# Patient Record
Sex: Female | Born: 1995 | Race: Black or African American | Hispanic: No | Marital: Single | Smoking: Current some day smoker
Health system: Southern US, Community
[De-identification: ages and names within clinical notes are randomized; demographics above are authoritative.]

## PROBLEM LIST (undated history)

## (undated) ENCOUNTER — Emergency Department (HOSPITAL_COMMUNITY): Payer: Medicaid Other | Source: Home / Self Care

## (undated) ENCOUNTER — Inpatient Hospital Stay (HOSPITAL_COMMUNITY): Payer: Self-pay

## (undated) DIAGNOSIS — O139 Gestational [pregnancy-induced] hypertension without significant proteinuria, unspecified trimester: Secondary | ICD-10-CM

## (undated) DIAGNOSIS — J45909 Unspecified asthma, uncomplicated: Secondary | ICD-10-CM

## (undated) HISTORY — PX: NO PAST SURGERIES: SHX2092

---
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
## 2017-01-23 ENCOUNTER — Other Ambulatory Visit (HOSPITAL_COMMUNITY)
Admission: RE | Admit: 2017-01-23 | Discharge: 2017-01-23 | Disposition: A | Discharge status: Home / Self Care | Payer: Medicaid Other | Source: Ambulatory Visit | Attending: Obstetrics | Admitting: Obstetrics

## 2017-01-23 ENCOUNTER — Encounter: Payer: Self-pay | Admitting: Obstetrics

## 2017-01-23 ENCOUNTER — Ambulatory Visit (INDEPENDENT_AMBULATORY_CARE_PROVIDER_SITE_OTHER): Payer: Medicaid Other | Admitting: Obstetrics

## 2017-01-23 VITALS — BP 128/89 | HR 77 | Wt 229.6 lb

## 2017-01-23 DIAGNOSIS — Z113 Encounter for screening for infections with a predominantly sexual mode of transmission: Secondary | ICD-10-CM

## 2017-01-23 DIAGNOSIS — Z0000 Encounter for general adult medical examination without abnormal findings: Secondary | ICD-10-CM

## 2017-01-23 DIAGNOSIS — Z3046 Encounter for surveillance of implantable subdermal contraceptive: Secondary | ICD-10-CM

## 2017-01-23 DIAGNOSIS — Z01419 Encounter for gynecological examination (general) (routine) without abnormal findings: Secondary | ICD-10-CM

## 2017-01-23 MED ORDER — PROVIDA OB 20-20-1.25 MG PO CAPS: 1.0000 | ORAL_CAPSULE | Freq: Every day | ORAL | 11 refills | Status: DC

## 2017-01-23 NOTE — Addendum Note (Signed)
Addended by: Coral CeoHARPER, CHARLES A on: 01/23/2017 10:58 AM   Modules accepted: Orders

## 2017-01-23 NOTE — Progress Notes (Signed)
Subjective:        Emma Jacobs is a 21 y.o. female here for a routine exam.  Current complaints: None.    Personal health questionnaire:  Is patient Emma Jacobs, have a family history of breast and/or ovarian cancer: no Is there a family history of uterine cancer diagnosed at age < 7550, gastrointestinal cancer, urinary tract cancer, family member who is a Personnel officerLynch syndrome-associated carrier: no Is the patient overweight and hypertensive, family history of diabetes, personal history of gestational diabetes, preeclampsia or PCOS: no Is patient over 555, have PCOS,  family history of premature CHD under age 19, diabetes, smoke, have hypertension or peripheral artery disease:  no At any time, has a partner hit, kicked or otherwise hurt or frightened you?: no Over the past 2 weeks, have you felt down, depressed or hopeless?: no Over the past 2 weeks, have you felt little interest or pleasure in doing things?:no   Gynecologic History Patient's last menstrual period was 01/22/2017. Contraception: Nexplanon Last Pap: n/a. Results were: n/a Last mammogram: n/a. Results were: n/a  Obstetric History OB History  Gravida Para Term Preterm AB Living  1 1 1     1   SAB TAB Ectopic Multiple Live Births          1    # Outcome Date GA Lbr Len/2nd Weight Sex Delivery Anes PTL Lv  1 Term 01/25/14 3625w2d 15:18 / 10:13 8 lb 2.2 oz (3.69 kg) M CS-LTranv EPI  LIV      Past Medical History:  Diagnosis Date  . Asthma    currently uses inhaler  . Gestational hypertension     Past Surgical History:  Procedure Laterality Date  . CESAREAN SECTION N/A 01/25/2014   Procedure: CESAREAN SECTION;  Surgeon: Antionette CharLisa Jackson-Moore, MD;  Location: [facility];  Service: Obstetrics;  Laterality: N/A;  . NO PAST SURGERIES      No current outpatient prescriptions on file. No Known Allergies  Social History  Substance Use Topics  . Smoking status: Never Smoker  . Smokeless tobacco: Never Used  . Alcohol use  No    Family History  Problem Relation Age of Onset  . Hypertension Maternal Grandmother   . Diabetes Maternal Grandmother       Review of Systems  Constitutional: negative for fatigue and weight loss Respiratory: negative for cough and wheezing Cardiovascular: negative for chest pain, fatigue and palpitations Gastrointestinal: negative for abdominal pain and change in bowel habits Musculoskeletal:negative for myalgias Neurological: negative for gait problems and tremors Behavioral/Psych: negative for abusive relationship, depression Endocrine: negative for temperature intolerance    Genitourinary:negative for abnormal menstrual periods, genital lesions, hot flashes, sexual problems and vaginal discharge Integument/breast: negative for breast lump, breast tenderness, nipple discharge and skin lesion(s)    Objective:       BP 128/89   Pulse 77   Wt 229 lb 9.6 oz (104.1 kg)   LMP 01/22/2017 Comment: irregular cycles with nexplanon  BMI 38.21 kg/m  General:   alert  Skin:   no rash or abnormalities  Lungs:   clear to auscultation bilaterally  Heart:   regular rate and rhythm, S1, S2 normal, no murmur, click, rub or gallop  Breasts:   normal without suspicious masses, skin or nipple changes or axillary nodes  Abdomen:  normal findings: no organomegaly, soft, non-tender and no hernia  Pelvis:  External genitalia: normal general appearance Urinary system: urethral meatus normal and bladder without fullness, nontender Vaginal: normal without tenderness, induration  or masses Cervix: normal appearance Adnexa: normal bimanual exam Uterus: anteverted and non-tender, normal size   Lab Review Urine pregnancy test Labs reviewed yes Radiologic studies reviewed no  50% of 20 min visit spent on counseling and coordination of care.    Assessment:    Healthy female exam.    Contraceptive Management   Plan:    Education reviewed: calcium supplements, low fat, low cholesterol  diet, safe sex/STD prevention and weight bearing exercise. Contraception: Nexplanon. Follow up in: 3 weeks.  Nexplanon removal and insertion of new Nexplanon  No orders of the defined types were placed in this encounter.  No orders of the defined types were placed in this encounter.    Patient ID: Emma Jacobs, female   DOB: 20-Dec-1995, 21 y.o.   MRN: [id number]

## 2017-01-23 NOTE — Progress Notes (Signed)
Patient is in the office today for her nexplanon removal- she would like a replacement. She elects to do a removal/replacement at one time- so we are going to order her device today and she will come back for that .  She is not sexually active now- but she has been in the past. She request STD testing today with her gyn exam.

## 2017-01-24 LAB — CERVICOVAGINAL ANCILLARY ONLY
BACTERIAL VAGINITIS: POSITIVE — AB
CHLAMYDIA, DNA PROBE: POSITIVE — AB
Candida vaginitis: NEGATIVE
NEISSERIA GONORRHEA: NEGATIVE
TRICH (WINDOWPATH): NEGATIVE

## 2017-01-25 ENCOUNTER — Other Ambulatory Visit: Payer: Self-pay | Admitting: Obstetrics

## 2017-01-25 DIAGNOSIS — N760 Acute vaginitis: Secondary | ICD-10-CM

## 2017-01-25 DIAGNOSIS — A5602 Chlamydial vulvovaginitis: Secondary | ICD-10-CM

## 2017-01-25 DIAGNOSIS — B9689 Other specified bacterial agents as the cause of diseases classified elsewhere: Secondary | ICD-10-CM

## 2017-01-25 MED ORDER — AZITHROMYCIN 250 MG PO TABS: 1000.0000 mg | ORAL_TABLET | Freq: Once | ORAL | 0 refills | Status: AC

## 2017-01-25 MED ORDER — CEFIXIME 400 MG PO TABS: 400.0000 mg | ORAL_TABLET | Freq: Once | ORAL | 0 refills | Status: AC

## 2017-01-25 MED ORDER — TINIDAZOLE 500 MG PO TABS: 1000.0000 mg | ORAL_TABLET | Freq: Every day | ORAL | 2 refills | Status: DC

## 2017-01-28 ENCOUNTER — Telehealth: Payer: Self-pay

## 2017-01-28 NOTE — Telephone Encounter (Signed)
Spoke with patient and advised of results, rx and follow up testing. Patient stated that she has an appt on the 15th and would schedule follow up appt then.

## 2017-02-14 ENCOUNTER — Ambulatory Visit: Payer: Medicaid Other | Admitting: Obstetrics

## 2017-04-16 ENCOUNTER — Encounter: Payer: Self-pay | Admitting: Obstetrics

## 2017-04-16 ENCOUNTER — Other Ambulatory Visit (HOSPITAL_COMMUNITY)
Admission: RE | Admit: 2017-04-16 | Discharge: 2017-04-16 | Disposition: A | Discharge status: Home / Self Care | Payer: Medicaid Other | Source: Ambulatory Visit | Attending: Obstetrics | Admitting: Obstetrics

## 2017-04-16 ENCOUNTER — Ambulatory Visit (INDEPENDENT_AMBULATORY_CARE_PROVIDER_SITE_OTHER): Payer: Medicaid Other | Admitting: Obstetrics

## 2017-04-16 ENCOUNTER — Encounter: Payer: Self-pay | Admitting: *Deleted

## 2017-04-16 VITALS — BP 114/76 | Wt 234.6 lb

## 2017-04-16 DIAGNOSIS — Z3049 Encounter for surveillance of other contraceptives: Secondary | ICD-10-CM | POA: Diagnosis not present

## 2017-04-16 DIAGNOSIS — Z3046 Encounter for surveillance of implantable subdermal contraceptive: Secondary | ICD-10-CM

## 2017-04-16 DIAGNOSIS — Z3202 Encounter for pregnancy test, result negative: Secondary | ICD-10-CM

## 2017-04-16 DIAGNOSIS — Z8619 Personal history of other infectious and parasitic diseases: Secondary | ICD-10-CM | POA: Diagnosis not present

## 2017-04-16 LAB — POCT URINE PREGNANCY: PREG TEST UR: NEGATIVE

## 2017-04-16 NOTE — Progress Notes (Signed)
Nexplanon removal expired 3/15. Nexplanon reinsertion today. Last Annual 01/23/17. +CT 01/23/17. Txt completed per pt, TOC due.

## 2017-04-16 NOTE — Progress Notes (Signed)
NEXPLANON REMOVAL NOTE  Date of LMP:   unknown  Contraception used: *Nexplanon   Indications:  The patient desires contraception.  She understands risks, benefits, and alternatives to Implanon and would like to proceed.  Anesthesia:   Lidocaine 1% plain.  Procedure:  A time-out was performed confirming the procedure and the patient's allergy status.  Complications: None                      The rod was palpated and the area was sterilely prepped.  The area beneath the distal tip was anesthetized with 1% xylocaine and the skin incised                       Over the tip and the tip was exposed, grasped with forcep and removed intact.  A single suture of 4-0 Vicryl was used to close incision.  Steri strip applied.                          Nexplanon Procedure Note   PRE-OP DIAGNOSIS: desired long-term, reversible contraception ( LARC ) POST-OP DIAGNOSIS: Same  PROCEDURE: Nexplanon  placement Performing Provider: Bing Neighborsharles A. Clearance CootsHarper MD  Patient education prior to procedure, explained risk, benefits of Nexplanon, reviewed alternative options. Patient reported understanding. Gave consent to continue with procedure.   PROCEDURE:  Pregnancy Text :  Negative Site (check):      left arm         Sterile Preparation:   Betadinex3 Lot # [id number] Expiration Date 05 / 2020  Insertion site was selected 8 - 10 cm from medial epicondyle and marked along with guiding site using sterile marker. Procedure area was prepped and draped in a sterile fashion. 1% Lidocaine 1.5 ml given prior to procedure. Nexplanon  was inserted subcutaneously.Needle was removed from the insertion site. Nexplanon capsule was palpated by provider and patient to assure satisfactory placement. Dressing applied.  Followup: The patient tolerated the procedure well without complications.  Standard post-procedure care is explained and return precautions are given.  Clyde Upshaw A. Clearance CootsHarper MD

## 2017-04-17 LAB — URINE CYTOLOGY ANCILLARY ONLY
Chlamydia: NEGATIVE
Neisseria Gonorrhea: NEGATIVE
Trichomonas: NEGATIVE

## 2017-04-18 ENCOUNTER — Encounter: Payer: Self-pay | Admitting: Obstetrics

## 2017-05-01 ENCOUNTER — Ambulatory Visit: Payer: Medicaid Other | Admitting: Obstetrics

## 2019-04-14 ENCOUNTER — Other Ambulatory Visit: Payer: Self-pay

## 2019-04-14 ENCOUNTER — Ambulatory Visit (HOSPITAL_COMMUNITY)
Admission: EM | Admit: 2019-04-14 | Discharge: 2019-04-14 | Disposition: A | Discharge status: Home / Self Care | Payer: Medicaid Other | Attending: Family Medicine | Admitting: Family Medicine

## 2019-04-14 ENCOUNTER — Encounter (HOSPITAL_COMMUNITY): Payer: Self-pay | Admitting: Family Medicine

## 2019-04-14 DIAGNOSIS — T7840XA Allergy, unspecified, initial encounter: Secondary | ICD-10-CM | POA: Diagnosis not present

## 2019-04-14 MED ORDER — PREDNISONE 20 MG PO TABS: ORAL_TABLET | ORAL | 0 refills | Status: DC

## 2019-04-14 NOTE — ED Triage Notes (Signed)
Pt states on Sunday she broke out in hives all over her body, states the hives went away but now she has itchiness all over her body.

## 2019-04-14 NOTE — ED Provider Notes (Addendum)
[facility]    CSN: [id number] Arrival date & time: 04/14/19  1539     History   Chief Complaint Chief Complaint  Patient presents with  . Allergic Reaction    HPI Emma Jacobs is a 23 y.o. female.   Initial [facility] visit for this 23 yo woman complaining of hand and foot discomfort on palms and soles for 3 days with no new medication or exposure known.  No fever, shortness of breath, h/o hives, swelling in mouth.  Unemployed.  Not in school.     Past Medical History:  Diagnosis Date  . Asthma    currently uses inhaler  . Gestational hypertension     Patient Active Problem List   Diagnosis Date Noted  . Cesarean delivery delivered 01/25/2014  . Labor and delivery indication for care or intervention 01/24/2014  . Pregnancy 12/25/2013  . Gestational hypertension 12/15/2013  . Pyelectasis of fetus on prenatal ultrasound 09/18/2013  . Teen pregnancy 08/11/2013  . Family history of diabetes mellitus (DM) 08/11/2013  . Obesity 08/11/2013    Past Surgical History:  Procedure Laterality Date  . CESAREAN SECTION N/A 01/25/2014   Procedure: CESAREAN SECTION;  Surgeon: Antionette CharLisa Jackson-Moore, MD;  Location: [facility];  Service: Obstetrics;  Laterality: N/A;  . NO PAST SURGERIES      OB History    Gravida  1   Para  1   Term  1   Preterm      AB      Living  1     SAB      TAB      Ectopic      Multiple      Live Births  1            Home Medications    Prior to Admission medications   Medication Sig Start Date End Date Taking? Authorizing Provider  predniSONE (DELTASONE) 20 MG tablet Two daily with food 04/14/19   Elvina SidleLauenstein, Brando Taves, MD    Family History Family History  Problem Relation Age of Onset  . Hypertension Maternal Grandmother   . Diabetes Maternal Grandmother     Social History Social History   Tobacco Use  . Smoking status: Never Smoker  . Smokeless tobacco: Never Used  Substance Use Topics  . Alcohol use: No  .  Drug use: No     Allergies   Patient has no known allergies.   Review of Systems Review of Systems   Physical Exam Triage Vital Signs ED Triage Vitals  Enc Vitals Group     BP      Pulse      Resp      Temp      Temp src      SpO2      Weight      Height      Head Circumference      Peak Flow      Pain Score      Pain Loc      Pain Edu?      Excl. in GC?    No data found.  Updated Vital Signs BP (!) 170/110   Pulse (!) 118   Temp 98.4 F (36.9 C)   Resp 18   LMP 03/27/2019   SpO2 100%    Physical Exam Vitals signs and nursing note reviewed.  Constitutional:      Appearance: Normal appearance.  HENT:     Nose: Nose normal.  Mouth/Throat:     Mouth: Mucous membranes are moist.  Eyes:     Extraocular Movements: Extraocular movements intact.     Pupils: Pupils are equal, round, and reactive to light.  Cardiovascular:     Rate and Rhythm: Normal rate and regular rhythm.  Pulmonary:     Effort: Pulmonary effort is normal.     Breath sounds: Normal breath sounds.  Musculoskeletal: Normal range of motion.  Skin:    General: Skin is warm and dry.     Comments: Few wheals on volar forearms  Neurological:     General: No focal deficit present.     Mental Status: She is alert.      UC Treatments / Results  Labs (all labs ordered are listed, but only abnormal results are displayed) Labs Reviewed - No data to display  EKG None  Radiology No results found.  Procedures Procedures (including critical care time)  Medications Ordered in UC Medications - No data to display  Initial Impression / Assessment and Plan / UC Course  I have reviewed the triage vital signs and the nursing notes.  Pertinent labs & imaging results that were available during my care of the patient were reviewed by me and considered in my medical decision making (see chart for details).    Final Clinical Impressions(s) / UC Diagnoses   Final diagnoses:  Allergic  reaction, initial encounter   Discharge Instructions   None    ED Prescriptions    Medication Sig Dispense Auth. Provider   predniSONE (DELTASONE) 20 MG tablet Two daily with food 10 tablet Elvina Sidle, MD     Controlled Substance Prescriptions [place] Controlled Substance Registry consulted? Not Applicable   Elvina Sidle, MD 04/14/19 1607    Elvina Sidle, MD 04/14/19 [phone]

## 2019-07-06 ENCOUNTER — Other Ambulatory Visit: Payer: Self-pay

## 2019-07-06 DIAGNOSIS — Z20822 Contact with and (suspected) exposure to covid-19: Secondary | ICD-10-CM

## 2019-07-07 LAB — NOVEL CORONAVIRUS, NAA: SARS-CoV-2, NAA: NOT DETECTED

## 2019-07-09 ENCOUNTER — Telehealth: Payer: Self-pay | Admitting: General Practice

## 2019-07-09 NOTE — Telephone Encounter (Signed)
Patient called in and received her covid test results °

## 2019-07-22 ENCOUNTER — Encounter (HOSPITAL_COMMUNITY): Payer: Self-pay | Admitting: Emergency Medicine

## 2019-07-22 ENCOUNTER — Ambulatory Visit (HOSPITAL_COMMUNITY)
Admission: EM | Admit: 2019-07-22 | Discharge: 2019-07-22 | Disposition: A | Discharge status: Home / Self Care | Payer: Medicaid Other | Attending: Family Medicine | Admitting: Family Medicine

## 2019-07-22 ENCOUNTER — Other Ambulatory Visit: Payer: Self-pay

## 2019-07-22 DIAGNOSIS — H9202 Otalgia, left ear: Secondary | ICD-10-CM | POA: Diagnosis not present

## 2019-07-22 DIAGNOSIS — T169XXA Foreign body in ear, unspecified ear, initial encounter: Secondary | ICD-10-CM

## 2019-07-22 MED ORDER — IBUPROFEN 800 MG PO TABS
ORAL_TABLET | ORAL | Status: AC
  Filled 2019-07-22: qty 1

## 2019-07-22 MED ORDER — IBUPROFEN 800 MG PO TABS: 800.0000 mg | ORAL_TABLET | Freq: Three times a day (TID) | ORAL | 0 refills | Status: DC

## 2019-07-22 MED ORDER — AMOXICILLIN 875 MG PO TABS: 875.0000 mg | ORAL_TABLET | Freq: Two times a day (BID) | ORAL | 0 refills | Status: AC

## 2019-07-22 MED ORDER — IBUPROFEN 800 MG PO TABS
800.0000 mg | ORAL_TABLET | Freq: Once | ORAL | Status: AC
  Administered 2019-07-22: 800 mg via ORAL

## 2019-07-22 NOTE — ED Triage Notes (Signed)
Pt here for left ear pain 

## 2019-07-22 NOTE — ED Provider Notes (Signed)
[place]   [id number] 07/22/19 Arrival Time: 9767  ASSESSMENT & PLAN:  1. Otalgia of left ear   2. Acute foreign body of ear canal, initial encounter     Feeling better after flushing ear. Unable to remove with curette. Pieces of insect removed. Ear canal remains very swollen and erythematous. Questions some erythema of TM; difficult to fully visualize. Will cover for infection. Discussed.  Meds ordered this encounter  Medications  . ibuprofen (ADVIL) tablet 800 mg  . amoxicillin (AMOXIL) 875 MG tablet    Sig: Take 1 tablet (875 mg total) by mouth 2 (two) times daily for 7 days.    Dispense:  14 tablet    Refill:  0  . ibuprofen (ADVIL) 800 MG tablet    Sig: Take 1 tablet (800 mg total) by mouth 3 (three) times daily with meals.    Dispense:  21 tablet    Refill:  0   If not improving within the next 24-48 hours, recommend: Follow-up Information    Schedule an appointment as soon as possible for a visit  with [facility].   Contact information: [address] [place] [address] [phone]          Reviewed expectations re: course of current medical issues. Questions answered. Outlined signs and symptoms indicating need for more acute intervention. Patient verbalized understanding. After Visit Summary given.   SUBJECTIVE: History from: patient.  Emma Jacobs is a 23 y.o. female who presents with complaint of left otalgia; without drainage; without bleeding. Onset gradual, over the past week she thinks. "Thought a bug may have flew into my ear". Used tissue and a Q-tip to "clean" her ear. No acute pain. Ear discomfort slowly increasing. Slightly muffled hearing. Recent cold symptoms: none. Fever: no. Overall normal PO intake without n/v. Sick contacts: no. OTC medications: none reproted.  Social History   Tobacco Use  Smoking Status Never Smoker  Smokeless Tobacco Never Used    ROS: As per HPI. All  other systems negative.    OBJECTIVE:  Vitals:   07/22/19 1116  BP: (!) 154/89  Pulse: 78  Resp: 18  Temp: 98.6 F (37 C)  TempSrc: Oral  SpO2: 98%    General appearance: alert; appears fatigued Ear Canal: edema and inflammation on the left TM: difficult to visualized left TM Oropharynx: moist Neck: supple without LAD Lungs: unlabored respirations, symmetrical air entry; cough: absent; no respiratory distress Skin: warm and dry Ext: no edema Psychological: alert and cooperative; normal mood and affect  No Known Allergies  Past Medical History:  Diagnosis Date  . Asthma    currently uses inhaler  . Gestational hypertension    Family History  Problem Relation Age of Onset  . Hypertension Maternal Grandmother   . Diabetes Maternal Grandmother    Social History   Socioeconomic History  . Marital status: Single    Spouse name: Not on file  . Number of children: Not on file  . Years of education: Not on file  . Highest education level: Not on file  Occupational History  . Not on file  Social Needs  . Financial resource strain: Not on file  . Food insecurity    Worry: Not on file    Inability: Not on file  . Transportation needs    Medical: Not on file    Non-medical: Not on file  Tobacco Use  . Smoking status: Never Smoker  . Smokeless tobacco:  Never Used  Substance and Sexual Activity  . Alcohol use: No  . Drug use: No  . Sexual activity: Not Currently    Partners: Male    Birth control/protection: Implant  Lifestyle  . Physical activity    Days per week: Not on file    Minutes per session: Not on file  . Stress: Not on file  Relationships  . Social Musicianconnections    Talks on phone: Not on file    Gets together: Not on file    Attends religious service: Not on file    Active member of club or organization: Not on file    Attends meetings of clubs or organizations: Not on file    Relationship status: Not on file  . Intimate partner violence     Fear of current or ex partner: Not on file    Emotionally abused: Not on file    Physically abused: Not on file    Forced sexual activity: Not on file  Other Topics Concern  . Not on file  Social History Narrative  . Not on file            Mardella LaymanHagler, Aki Abalos, MD 07/22/19 1200

## 2020-05-10 ENCOUNTER — Ambulatory Visit: Payer: Medicaid Other | Admitting: Obstetrics

## 2020-05-16 ENCOUNTER — Ambulatory Visit: Payer: Medicaid Other | Admitting: Obstetrics

## 2020-08-29 ENCOUNTER — Encounter: Payer: Self-pay | Admitting: Obstetrics

## 2020-08-29 ENCOUNTER — Other Ambulatory Visit: Payer: Self-pay

## 2020-08-29 ENCOUNTER — Ambulatory Visit (INDEPENDENT_AMBULATORY_CARE_PROVIDER_SITE_OTHER): Payer: Medicaid Other | Admitting: Obstetrics

## 2020-08-29 VITALS — BP 159/99 | HR 78 | Wt 259.0 lb

## 2020-08-29 DIAGNOSIS — Z3046 Encounter for surveillance of implantable subdermal contraceptive: Secondary | ICD-10-CM | POA: Diagnosis not present

## 2020-08-29 NOTE — Progress Notes (Signed)
NEXPLANON REMOVAL NOTE  Date of LMP:   unknown  Contraception used: *Nexplanon   Indications:  The patient desires removal of Nexplanon.  She understands risks, benefits, and alternatives to Implanon and would like to proceed.  Anesthesia:   Lidocaine 1% plain.  Procedure:  A time-out was performed confirming the procedure and the patient's allergy status.  Complications: None                      The rod was palpated and the area was sterilely prepped.  The area beneath the distal tip was anesthetized with 1% xylocaine and the skin incised                       Over the tip and the tip was exposed, grasped with forcep and removed intact.  A single suture of 4-0 Vicryl was used to close incision.  Steri strip                       And a bandage applied and the arm was wrapped with gauze bandage.  The patient tolerated well.  Instructions:  The patient was instructed to remove the dressing in 24 hours and that some bruising is to be expected.  She was advised to use over the counter analgesics as needed for any pain at the site.  She is to keep the area dry for 24 hours and to call if her hand or arm becomes cold, numb, or blue.  Return visit:  Return in 2 weeks   Brock Bad, MD 08/29/2020 3:34 PM

## 2020-09-12 ENCOUNTER — Encounter: Payer: Self-pay | Admitting: Obstetrics

## 2020-09-12 ENCOUNTER — Ambulatory Visit (INDEPENDENT_AMBULATORY_CARE_PROVIDER_SITE_OTHER): Payer: Medicaid Other | Admitting: Obstetrics

## 2020-09-12 ENCOUNTER — Other Ambulatory Visit: Payer: Self-pay

## 2020-09-12 VITALS — BP 151/95 | HR 89 | Ht 65.0 in | Wt 256.1 lb

## 2020-09-12 DIAGNOSIS — Z09 Encounter for follow-up examination after completed treatment for conditions other than malignant neoplasm: Secondary | ICD-10-CM | POA: Diagnosis not present

## 2020-09-12 NOTE — Progress Notes (Signed)
Subjective:    Emma Jacobs is a 24 y.o. female who presents for follow up after Nexplanon removal. The patient has no complaints today. The patient is sexually active. Pertinent past medical history: hypertension.  The information documented in the HPI was reviewed and verified.  Menstrual History: OB History    Gravida  1   Para  1   Term  1   Preterm      AB      Living  1     SAB      TAB      Ectopic      Multiple      Live Births  1            Patient's last menstrual period was 09/12/2020.   Patient Active Problem List   Diagnosis Date Noted  . Cesarean delivery delivered 01/25/2014  . Labor and delivery indication for care or intervention 01/24/2014  . Pregnancy 12/25/2013  . Gestational hypertension 12/15/2013  . Pyelectasis of fetus on prenatal ultrasound 09/18/2013  . Teen pregnancy 08/11/2013  . Family history of diabetes mellitus (DM) 08/11/2013  . Obesity 08/11/2013   Past Medical History:  Diagnosis Date  . Asthma    currently uses inhaler  . Gestational hypertension     Past Surgical History:  Procedure Laterality Date  . CESAREAN SECTION N/A 01/25/2014   Procedure: CESAREAN SECTION;  Surgeon: Antionette Char, MD;  Location: [facility];  Service: Obstetrics;  Laterality: N/A;  . NO PAST SURGERIES       Current Outpatient Medications:  .  ibuprofen (ADVIL) 800 MG tablet, Take 1 tablet (800 mg total) by mouth 3 (three) times daily with meals. (Patient not taking: Reported on 08/29/2020), Disp: 21 tablet, Rfl: 0 No Known Allergies  Social History   Tobacco Use  . Smoking status: Never Smoker  . Smokeless tobacco: Never Used  Substance Use Topics  . Alcohol use: No    Family History  Problem Relation Age of Onset  . Hypertension Maternal Grandmother   . Diabetes Maternal Grandmother        Review of Systems Constitutional: negative for weight loss Genitourinary:negative for abnormal menstrual periods and vaginal  discharge   Objective:   BP (!) 151/95   Pulse 89   Ht 5\' 5"  (1.651 m)   Wt 256 lb 1.6 oz (116.2 kg)   LMP 09/12/2020   BMI 42.62 kg/m    General:   alert and no distress  Skin:   no rash or abnormalities  Lungs:   clear to auscultation bilaterally  Heart:   regular rate and rhythm, S1, S2 normal, no murmur, click, rub or gallop  Breasts:   normal without suspicious masses, skin or nipple changes or axillary nodes  Abdomen:  normal findings: no organomegaly, soft, non-tender and no hernia  Pelvis:  External genitalia: normal general appearance Urinary system: urethral meatus normal and bladder without fullness, nontender Vaginal: normal without tenderness, induration or masses Cervix: normal appearance Adnexa: normal bimanual exam Uterus: anteverted and non-tender, normal size   Lab Review Urine pregnancy test Labs reviewed yes Radiologic studies reviewed no  50% of 15 min visit spent on counseling and coordination of care.    Assessment:    24 y.o., discontinuing Nexplanon, no contraindications.   Declines further contraception at this time.  Condoms encouraged to prevent STD's.  Plan:    All questions answered. Follow up in 3 months.  Annual / Pap  23, MD  09/12/2020 3:04 PM

## 2020-09-12 NOTE — Progress Notes (Signed)
Pt is in the office for nexplanon removal follow up from 08-29-20. Pt denies any complications.

## 2021-02-20 ENCOUNTER — Emergency Department (HOSPITAL_COMMUNITY)
Admission: EM | Admit: 2021-02-20 | Discharge: 2021-02-20 | Disposition: A | Discharge status: Home / Self Care | Payer: Medicaid Other | Attending: Emergency Medicine | Admitting: Emergency Medicine

## 2021-02-20 DIAGNOSIS — J45909 Unspecified asthma, uncomplicated: Secondary | ICD-10-CM | POA: Insufficient documentation

## 2021-02-20 DIAGNOSIS — K5900 Constipation, unspecified: Secondary | ICD-10-CM | POA: Insufficient documentation

## 2021-02-20 DIAGNOSIS — R1013 Epigastric pain: Secondary | ICD-10-CM | POA: Diagnosis present

## 2021-02-20 DIAGNOSIS — M545 Low back pain, unspecified: Secondary | ICD-10-CM | POA: Insufficient documentation

## 2021-02-20 DIAGNOSIS — K2900 Acute gastritis without bleeding: Secondary | ICD-10-CM | POA: Diagnosis not present

## 2021-02-20 LAB — URINALYSIS, ROUTINE W REFLEX MICROSCOPIC
Bilirubin Urine: NEGATIVE
Glucose, UA: NEGATIVE mg/dL
Hgb urine dipstick: NEGATIVE
Ketones, ur: NEGATIVE mg/dL
Leukocytes,Ua: NEGATIVE
Nitrite: NEGATIVE
Protein, ur: NEGATIVE mg/dL
Specific Gravity, Urine: 1.003 — ABNORMAL LOW (ref 1.005–1.030)
pH: 6 (ref 5.0–8.0)

## 2021-02-20 LAB — COMPREHENSIVE METABOLIC PANEL
ALT: 10 U/L (ref 0–44)
AST: 16 U/L (ref 15–41)
Albumin: 3.6 g/dL (ref 3.5–5.0)
Alkaline Phosphatase: 85 U/L (ref 38–126)
Anion gap: 7 (ref 5–15)
BUN: <5 mg/dL — ABNORMAL LOW (ref 6–20)
CO2: 25 mmol/L (ref 22–32)
Calcium: 9.4 mg/dL (ref 8.9–10.3)
Chloride: 103 mmol/L (ref 98–111)
Creatinine, Ser: 0.57 mg/dL (ref 0.44–1.00)
GFR, Estimated: >60 mL/min (ref >60)
Glucose, Bld: 162 mg/dL — ABNORMAL HIGH (ref 70–99)
Potassium: 3.9 mmol/L (ref 3.5–5.1)
Sodium: 135 mmol/L (ref 135–145)
Total Bilirubin: 0.4 mg/dL (ref 0.3–1.2)
Total Protein: 7 g/dL (ref 6.5–8.1)

## 2021-02-20 LAB — CBC
HCT: 35.7 % — ABNORMAL LOW (ref 36.0–46.0)
Hemoglobin: 10.9 g/dL — ABNORMAL LOW (ref 12.0–15.0)
MCH: 22.9 pg — ABNORMAL LOW (ref 26.0–34.0)
MCHC: 30.5 g/dL (ref 30.0–36.0)
MCV: 75 fL — ABNORMAL LOW (ref 80.0–100.0)
Platelets: 324 10*3/uL (ref 150–400)
RBC: 4.76 MIL/uL (ref 3.87–5.11)
RDW: 15.9 % — ABNORMAL HIGH (ref 11.5–15.5)
WBC: 9.2 10*3/uL (ref 4.0–10.5)
nRBC: 0 % (ref 0.0–0.2)

## 2021-02-20 LAB — LIPASE, BLOOD: Lipase: 25 U/L (ref 11–51)

## 2021-02-20 LAB — I-STAT BETA HCG BLOOD, ED (MC, WL, AP ONLY): I-stat hCG, quantitative: <5 m[IU]/mL (ref <5)

## 2021-02-20 MED ORDER — ALUM & MAG HYDROXIDE-SIMETH 200-200-20 MG/5ML PO SUSP
30.0000 mL | Freq: Once | ORAL | Status: AC
Start: 2021-02-20 — End: 2021-02-20
  Administered 2021-02-20: 30 mL via ORAL
  Filled 2021-02-20: qty 30

## 2021-02-20 MED ORDER — KETOROLAC TROMETHAMINE 30 MG/ML IJ SOLN
30.0000 mg | Freq: Once | INTRAMUSCULAR | Status: AC
  Administered 2021-02-20: 30 mg via INTRAVENOUS
  Filled 2021-02-20: qty 1

## 2021-02-20 MED ORDER — FAMOTIDINE IN NACL 20-0.9 MG/50ML-% IV SOLN
20.0000 mg | Freq: Once | INTRAVENOUS | Status: AC
  Administered 2021-02-20: 20 mg via INTRAVENOUS
  Filled 2021-02-20: qty 50

## 2021-02-20 MED ORDER — FAMOTIDINE 20 MG PO TABS: 20.0000 mg | ORAL_TABLET | Freq: Two times a day (BID) | ORAL | 0 refills | Status: DC

## 2021-02-20 MED ORDER — SODIUM CHLORIDE 0.9 % IV BOLUS
1000.0000 mL | Freq: Once | INTRAVENOUS | Status: AC
  Administered 2021-02-20: 1000 mL via INTRAVENOUS

## 2021-02-20 NOTE — ED Provider Notes (Signed)
[facility] EMERGENCY DEPARTMENT Provider Note   CSN: [id number] Arrival date & time: 02/20/21  1305     History Chief Complaint  Patient presents with  . Abdominal Pain    Emma Jacobs is a 25 y.o. female with a past medical history of asthma presenting to the ED with a chief complaint of epigastric pain and lower back pain.  Symptoms began about 3 days ago.  States that she is alternated between constipation and diarrhea since her pain began.  She initially thought it was due to her menstrual cycle ending but typically has pain lower in her abdomen and pelvis area and not epigastric.  She has tried Excedrin with only minimal improvement in her symptoms.  Pain is intermittent without specific aggravating alleviating factor.  Denies any nausea, vomiting, urinary symptoms, fever, chest pain or shortness of breath.  No history of similar symptoms in the past.  Prior abdominal surgeries include C-section.  No sick contacts with similar symptoms or suspicious food intake.  HPI     Past Medical History:  Diagnosis Date  . Asthma    currently uses inhaler  . Gestational hypertension     Patient Active Problem List   Diagnosis Date Noted  . Cesarean delivery delivered 01/25/2014  . Labor and delivery indication for care or intervention 01/24/2014  . Pregnancy 12/25/2013  . Gestational hypertension 12/15/2013  . Pyelectasis of fetus on prenatal ultrasound 09/18/2013  . Teen pregnancy 08/11/2013  . Family history of diabetes mellitus (DM) 08/11/2013  . Obesity 08/11/2013    Past Surgical History:  Procedure Laterality Date  . CESAREAN SECTION N/A 01/25/2014   Procedure: CESAREAN SECTION;  Surgeon: Antionette Char, MD;  Location: [facility];  Service: Obstetrics;  Laterality: N/A;  . NO PAST SURGERIES       OB History    Gravida  1   Para  1   Term  1   Preterm      AB      Living  1     SAB      IAB      Ectopic      Multiple      Live  Births  1           Family History  Problem Relation Age of Onset  . Hypertension Maternal Grandmother   . Diabetes Maternal Grandmother     Social History   Tobacco Use  . Smoking status: Never Smoker  . Smokeless tobacco: Never Used  Substance Use Topics  . Alcohol use: No  . Drug use: No    Home Medications Prior to Admission medications   Medication Sig Start Date End Date Taking? Authorizing Provider  famotidine (PEPCID) 20 MG tablet Take 1 tablet (20 mg total) by mouth 2 (two) times daily. 02/20/21  Yes Khatri, Hina, PA-C  ibuprofen (ADVIL) 800 MG tablet Take 1 tablet (800 mg total) by mouth 3 (three) times daily with meals. Patient not taking: Reported on 08/29/2020 07/22/19   Mardella Layman, MD    Allergies    Patient has no known allergies.  Review of Systems   Review of Systems  Constitutional: Negative for appetite change, chills and fever.  HENT: Negative for ear pain, rhinorrhea, sneezing and sore throat.   Eyes: Negative for photophobia and visual disturbance.  Respiratory: Negative for cough, chest tightness, shortness of breath and wheezing.   Cardiovascular: Negative for chest pain and palpitations.  Gastrointestinal: Positive for abdominal pain, constipation and  diarrhea. Negative for blood in stool, nausea and vomiting.  Genitourinary: Negative for dysuria, hematuria and urgency.  Musculoskeletal: Positive for back pain and myalgias.  Skin: Negative for rash.  Neurological: Negative for dizziness, weakness and light-headedness.    Physical Exam Updated Vital Signs BP 135/84 (BP Location: Right Arm)   Pulse 85   Temp 98.9 F (37.2 C)   Resp 17   SpO2 100%   Physical Exam Vitals and nursing note reviewed.  Constitutional:      General: She is not in acute distress.    Appearance: She is well-developed.  HENT:     Head: Normocephalic and atraumatic.     Nose: Nose normal.  Eyes:     General: No scleral icterus.       Left eye: No  discharge.     Conjunctiva/sclera: Conjunctivae normal.  Cardiovascular:     Rate and Rhythm: Normal rate and regular rhythm.     Heart sounds: Normal heart sounds. No murmur heard. No friction rub. No gallop.   Pulmonary:     Effort: Pulmonary effort is normal. No respiratory distress.     Breath sounds: Normal breath sounds.  Abdominal:     General: Bowel sounds are normal. There is no distension.     Palpations: Abdomen is soft.     Tenderness: There is abdominal tenderness in the epigastric area. There is no guarding.  Musculoskeletal:        General: Normal range of motion.     Cervical back: Normal range of motion and neck supple.  Skin:    General: Skin is warm and dry.     Findings: No rash.  Neurological:     Mental Status: She is alert.     Motor: No abnormal muscle tone.     Coordination: Coordination normal.     ED Results / Procedures / Treatments   Labs (all labs ordered are listed, but only abnormal results are displayed) Labs Reviewed  COMPREHENSIVE METABOLIC PANEL - Abnormal; Notable for the following components:      Result Value   Glucose, Bld 162 (*)    BUN <5 (*)    All other components within normal limits  CBC - Abnormal; Notable for the following components:   Hemoglobin 10.9 (*)    HCT 35.7 (*)    MCV 75.0 (*)    MCH 22.9 (*)    RDW 15.9 (*)    All other components within normal limits  URINALYSIS, ROUTINE W REFLEX MICROSCOPIC - Abnormal; Notable for the following components:   Color, Urine COLORLESS (*)    Specific Gravity, Urine 1.003 (*)    All other components within normal limits  LIPASE, BLOOD  I-STAT BETA HCG BLOOD, ED ([facility], [facility], [facility] ONLY)    EKG None  Radiology No results found.  Procedures Procedures   Medications Ordered in ED Medications  famotidine (PEPCID) IVPB 20 mg premix (0 mg Intravenous Stopped 02/20/21 1749)  alum & mag hydroxide-simeth (MAALOX/MYLANTA) 200-200-20 MG/5ML suspension 30 mL (30 mLs Oral Given 02/20/21  1723)  sodium chloride 0.9 % bolus 1,000 mL (0 mLs Intravenous Stopped 02/20/21 1853)  ketorolac (TORADOL) 30 MG/ML injection 30 mg (30 mg Intravenous Given 02/20/21 1909)    ED Course  I have reviewed the triage vital signs and the nursing notes.  Pertinent labs & imaging results that were available during my care of the patient were reviewed by me and considered in my medical decision making (see chart for details).  MDM Rules/Calculators/A&P                          25 year old female with past medical history of asthma presenting to the ED with a chief complaint of epigastric pain and lower back pain.  Symptoms began about 3 days ago.  Reports that she has alternated between constipation and diarrhea since her pain began.  Some times has pain associated with her menstrual cycle but this typically lower in her abdomen pelvis area.  Minimal improvement noted with Excedrin prior to arrival.  No nausea, vomiting, urinary symptoms, fever, chest pain or shortness of breath.  On exam there is some tenderness in epigastric area without rebound or guarding.  No tenderness of the back noted.  No midline tenderness noted.  She speaking complete sentences without difficulty, no signs of respiratory distress.  Lab work here shows CBC with hemoglobin at baseline.  Urinalysis is unremarkable, no evidence of UTI that would concern me for [place] causing her back pain.  Lipase unremarkable.  CMP unremarkable.  hCG is negative.  Patient given antacids here, IV fluids with significant improvement in her symptoms.  Repeat abdominal exams are benign.  Suspect symptoms could be due to gastritis.  Doubt sinusitis, pancreatitis, cholecystitis or other surgical emergent cause of her symptoms based on her reassuring work-up and physical exam findings as well as improvement with medications.  We will treat symptomatically and have her follow-up with her PCP.  She is comfortable with this plan and is requesting discharge home.   Return precautions given.   Patient is hemodynamically stable, in NAD, and able to ambulate in the ED. Evaluation does not show pathology that would require ongoing emergent intervention or inpatient treatment. I explained the diagnosis to the patient. Pain has been managed and has no complaints prior to discharge. Patient is comfortable with above plan and is stable for discharge at this time. All questions were answered prior to disposition. Strict return precautions for returning to the ED were discussed. Encouraged follow up with PCP.   An After Visit Summary was printed and given to the patient.   Portions of this note were generated with Scientist, clinical (histocompatibility and immunogenetics). Dictation errors may occur despite best attempts at proofreading.  Final Clinical Impression(s) / ED Diagnoses Final diagnoses:  Acute gastritis without hemorrhage, unspecified gastritis type    Rx / DC Orders ED Discharge Orders         Ordered    famotidine (PEPCID) 20 MG tablet  2 times daily        02/20/21 1910           Dietrich Pates, PA-C 02/20/21 1911    Milagros Loll, MD 02/21/21 1943

## 2021-02-20 NOTE — ED Triage Notes (Signed)
Pt reports epigastric pain and low back pain for 3 days. She states she also feels constipated, no n/v/d.

## 2021-02-20 NOTE — Discharge Instructions (Signed)
Take medications to help with your symptoms. Make sure you are drinking plenty fluids. Follow-up with your primary care provider. Return to the ER if you start to experience worsening abdominal pain, chest pain, shortness of breath or bloody stools.

## 2021-06-28 DIAGNOSIS — H5213 Myopia, bilateral: Secondary | ICD-10-CM | POA: Diagnosis not present

## 2022-10-12 ENCOUNTER — Ambulatory Visit (HOSPITAL_COMMUNITY): Payer: Medicaid Other

## 2022-10-12 ENCOUNTER — Encounter (HOSPITAL_COMMUNITY): Payer: Self-pay

## 2022-10-12 ENCOUNTER — Ambulatory Visit (HOSPITAL_COMMUNITY)
Admission: EM | Admit: 2022-10-12 | Discharge: 2022-10-12 | Disposition: A | Discharge status: Home / Self Care | Payer: Medicaid Other | Attending: Internal Medicine | Admitting: Internal Medicine

## 2022-10-12 DIAGNOSIS — I10 Essential (primary) hypertension: Secondary | ICD-10-CM | POA: Insufficient documentation

## 2022-10-12 DIAGNOSIS — N898 Other specified noninflammatory disorders of vagina: Secondary | ICD-10-CM | POA: Diagnosis not present

## 2022-10-12 MED ORDER — BLOOD PRESSURE CUFF MISC: 0 refills | Status: DC

## 2022-10-12 MED ORDER — FLUCONAZOLE 150 MG PO TABS: 150.0000 mg | ORAL_TABLET | ORAL | 0 refills | Status: DC

## 2022-10-12 MED ORDER — AMLODIPINE BESYLATE 5 MG PO TABS: 5.0000 mg | ORAL_TABLET | Freq: Every day | ORAL | 1 refills | Status: DC

## 2022-10-12 NOTE — ED Provider Notes (Signed)
[facility]    CSN: [id number] Arrival date & time: 10/12/22  6269      History   Chief Complaint Chief Complaint  Patient presents with   Vaginal Itching         HPI Emma Jacobs is a 26 y.o. female.   Patient presents urgent care for evaluation of vaginal itching that started 1 week ago and rash to the vaginal area that started 2 days ago.  She denies vaginal discharge and vaginal odor.  She states that the itching initially spread to the bilateral inner thighs and towards the gluteal area where she noticed some redness without drainage.  This has since improved and she states that the vaginal itching is now localized to the vaginal canal.  Reports 1 recent new unprotected female sexual partner who is not currently experiencing any symptoms.  She denies known exposure to STD.  Last menstrual cycle was October 01, 2022 (approximately 1 week ago).  Denies abdominal pain, nausea, vomiting, dizziness, low back pain, urinary symptoms, rash, and fever/chills.  Patient also reports a slight sore throat with visualized white patches to the back of the throat that started couple of days ago and has since resolved.  She states that her left tonsil appears to be bigger than the right one but she is no longer experiencing sore throat.  Denies muffled voice sounds/voice changes, neck pain, viral URI symptoms, body aches, and difficulty swallowing. She has been eating and drinking normally without difficulty.  She states that the sore throat got better after the white spots disappeared from the back of her throat.  Denies history of tonsil stones.  Denies type 2 diabetes diagnosis.   Patient's blood pressure is noticeably elevated in clinic at 163/125.  She denies headache, dizziness, and vision changes.  States she was first diagnosed with hypertension when she became pregnant 8 years ago as a teen and experienced gestational hypertension.  She does not currently take any medications for  hypertension and does not have a primary care provider.  Denies chest pain, shortness of breath, and weakness.   Vaginal Itching    Past Medical History:  Diagnosis Date   Asthma    currently uses inhaler   Gestational hypertension     Patient Active Problem List   Diagnosis Date Noted   Cesarean delivery delivered 01/25/2014   Labor and delivery indication for care or intervention 01/24/2014   Pregnancy 12/25/2013   Gestational hypertension 12/15/2013   Pyelectasis of fetus on prenatal ultrasound 09/18/2013   Teen pregnancy 08/11/2013   Family history of diabetes mellitus (DM) 08/11/2013   Obesity 08/11/2013    Past Surgical History:  Procedure Laterality Date   CESAREAN SECTION N/A 01/25/2014   Procedure: CESAREAN SECTION;  Surgeon: Antionette Char, MD;  Location: [facility];  Service: Obstetrics;  Laterality: N/A;   NO PAST SURGERIES      OB History     Gravida  1   Para  1   Term  1   Preterm      AB      Living  1      SAB      IAB      Ectopic      Multiple      Live Births  1            Home Medications    Prior to Admission medications   Medication Sig Start Date End Date Taking? Authorizing Provider  amLODipine (NORVASC) 5  MG tablet Take 1 tablet (5 mg total) by mouth daily. 10/12/22  Yes Carlisle Beers, FNP  fluconazole (DIFLUCAN) 150 MG tablet Take 1 tablet (150 mg total) by mouth every 3 (three) days. 10/12/22  Yes Carlisle Beers, FNP  famotidine (PEPCID) 20 MG tablet Take 1 tablet (20 mg total) by mouth 2 (two) times daily. 02/20/21   Khatri, Hina, PA-C  ibuprofen (ADVIL) 800 MG tablet Take 1 tablet (800 mg total) by mouth 3 (three) times daily with meals. Patient not taking: Reported on 08/29/2020 07/22/19   Mardella Layman, MD    Family History Family History  Problem Relation Age of Onset   Hypertension Maternal Grandmother    Diabetes Maternal Grandmother     Social History Social History   Tobacco Use    Smoking status: Every Day    Types: Cigars   Smokeless tobacco: Never  Vaping Use   Vaping Use: Every day  Substance Use Topics   Alcohol use: Not Currently   Drug use: Yes    Types: Marijuana     Allergies   Patient has no known allergies.   Review of Systems Review of Systems Per HPI  Physical Exam Triage Vital Signs ED Triage Vitals  Enc Vitals Group     BP 10/12/22 1039 (!) 163/125     Pulse Rate 10/12/22 1039 87     Resp --      Temp 10/12/22 1039 98.9 F (37.2 C)     Temp Source 10/12/22 1039 Oral     SpO2 10/12/22 1039 99 %     Weight --      Height --      Head Circumference --      Peak Flow --      Pain Score 10/12/22 1037 0     Pain Loc --      Pain Edu? --      Excl. in GC? --    No data found.  Updated Vital Signs BP (!) 163/125 (BP Location: Left Wrist)   Pulse 87   Temp 98.9 F (37.2 C) (Oral)   LMP 10/01/2022 (Exact Date)   SpO2 99%   Breastfeeding No   Visual Acuity Right Eye Distance:   Left Eye Distance:   Bilateral Distance:    Right Eye Near:   Left Eye Near:    Bilateral Near:     Physical Exam Vitals and nursing note reviewed. Exam conducted with a chaperone present (Arianna, CMA).  Constitutional:      Appearance: She is not ill-appearing or toxic-appearing.  HENT:     Head: Normocephalic and atraumatic.     Right Ear: Hearing and external ear normal.     Left Ear: Hearing and external ear normal.     Nose: Nose normal.     Mouth/Throat:     Lips: Pink.     Mouth: Mucous membranes are moist. No angioedema.     Tongue: No lesions. Tongue does not deviate from midline.     Palate: No mass and lesions.     Pharynx: Oropharynx is clear. No pharyngeal swelling or uvula swelling.     Tonsils: No tonsillar exudate or tonsillar abscesses. 0 on the right. 2+ on the left.  Eyes:     General: Lids are normal. Vision grossly intact. Gaze aligned appropriately.     Extraocular Movements: Extraocular movements intact.      Conjunctiva/sclera: Conjunctivae normal.  Pulmonary:     Effort: Pulmonary effort is  normal.  Genitourinary:    Exam position: Knee-chest position.     Pubic Area: No rash.      Labia:        Right: No rash, tenderness, lesion or injury.        Left: No rash, tenderness, lesion or injury.      Vagina: Vaginal discharge present.     Rectum: Normal.     Comments: White, malodorous, and thick vaginal discharge present to the vaginal os.  Musculoskeletal:     Cervical back: Neck supple.  Skin:    General: Skin is warm and dry.     Capillary Refill: Capillary refill takes less than 2 seconds.     Findings: No rash.  Neurological:     General: No focal deficit present.     Mental Status: She is alert and oriented to person, place, and time. Mental status is at baseline.     Cranial Nerves: No dysarthria or facial asymmetry.  Psychiatric:        Mood and Affect: Mood normal.        Speech: Speech normal.        Behavior: Behavior normal.        Thought Content: Thought content normal.        Judgment: Judgment normal.      UC Treatments / Results  Labs (all labs ordered are listed, but only abnormal results are displayed) Labs Reviewed - No data to display  EKG   Radiology No results found.  Procedures Procedures (including critical care time)  Medications Ordered in UC Medications - No data to display  Initial Impression / Assessment and Plan / UC Course  I have reviewed the triage vital signs and the nursing notes.  Pertinent labs & imaging results that were available during my care of the patient were reviewed by me and considered in my medical decision making (see chart for details).   1.  Vaginal itching Presentation is consistent with a vaginal yeast infection and therefore we will go ahead and treat with Diflucan once today then again in 3 days.  STI testing is pending and will come back in the next 2 or 3 days.  We will call patient if any of her other testing  is positive and treat her at that time based on results.  Advised to avoid sexual intercourse for 7 days while being treated for STD and condom use discussed.   2.  Sore throat Patient sore throat was likely related to tonsil stones and has completely resolved at this time.  Normal phonation, no concern for peritonsillar abscess.  She is not currently experiencing sore throat and may take Tylenol/ibuprofen as needed for any pain she may experience in the future.  She may also do warm salt water gargles to help with pain if it returns.  3.  Essential hypertension Blood pressure is very elevated in the clinic today without red flag signs or symptoms indicating need for immediate referral to the emergency department.  Discussed risks associated with sustained elevated blood pressure with patient including heart attack/stroke.  She agrees to start amlodipine 5 mg once daily today and blood pressure cuff has been sent to the pharmacy for her to use every day for the next 2 weeks, then 2-3 times per week.  DASH diet and increase exercise discussed.  PCP follow-up advised.  Discussed physical exam and available lab work findings in clinic with patient.  Counseled patient regarding appropriate use of medications and  potential side effects for all medications recommended or prescribed today. Discussed red flag signs and symptoms of worsening condition,when to call the PCP office, return to urgent care, and when to seek higher level of care in the emergency department. Patient verbalizes understanding and agreement with plan. All questions answered. Patient discharged in stable condition.   Final Clinical Impressions(s) / UC Diagnoses   Final diagnoses:  Vaginal itching  Essential hypertension     Discharge Instructions      Your STD testing has been sent to the lab and will come back in the next 2 to 3 days.  We will call you if any of your results are positive requiring treatment and treat you at that  time.   Go ahead and start taking Diflucan once today then again in 3 days to treat suspected vaginal yeast infection.  Avoid sexual intercourse until your STD results come back.  If any of your STD results are positive, you will need to avoid sexual intercourse for 7 days while you are being treated to prevent spread of STD.  Condom use is the best way to prevent spread of STDs.  Your blood pressure was very elevated in the clinic today.   Start taking amlodipine blood pressure medicine 5 mg once daily to help gradually lower your blood pressure. Take your blood pressure once daily for the next couple of weeks while your blood pressure medicine kicks in and write these numbers down in a notebook.  After 2 weeks, you may take your blood pressure 3-4 times a week.  Bring the notebook to your primary care provider visit so that they are able to see what your blood pressures have been at home and make medical decisions based off of this data.   Review the information in your discharge paperwork regarding the DASH diet to lower blood pressure.  Limit the amount of salt in your diet as this can help to lower your blood pressure as well.  If you develop any new or worsening symptoms or do not improve in the next 2 to 3 days, please return.  If your symptoms are severe, please go to the emergency room.  Follow-up with your primary care provider for further evaluation and management of your symptoms as well as ongoing wellness visits.  I hope you feel better!     ED Prescriptions     Medication Sig Dispense Auth. Provider   amLODipine (NORVASC) 5 MG tablet Take 1 tablet (5 mg total) by mouth daily. 30 tablet Reita May M, FNP   fluconazole (DIFLUCAN) 150 MG tablet Take 1 tablet (150 mg total) by mouth every 3 (three) days. 2 tablet Carlisle Beers, FNP      PDMP not reviewed this encounter.   Carlisle Beers, [place] 10/12/22 1157

## 2022-10-12 NOTE — Discharge Instructions (Signed)
Your STD testing has been sent to the lab and will come back in the next 2 to 3 days.  We will call you if any of your results are positive requiring treatment and treat you at that time.   Go ahead and start taking Diflucan once today then again in 3 days to treat suspected vaginal yeast infection.  Avoid sexual intercourse until your STD results come back.  If any of your STD results are positive, you will need to avoid sexual intercourse for 7 days while you are being treated to prevent spread of STD.  Condom use is the best way to prevent spread of STDs.  Your blood pressure was very elevated in the clinic today.   Start taking amlodipine blood pressure medicine 5 mg once daily to help gradually lower your blood pressure. Take your blood pressure once daily for the next couple of weeks while your blood pressure medicine kicks in and write these numbers down in a notebook.  After 2 weeks, you may take your blood pressure 3-4 times a week.  Bring the notebook to your primary care provider visit so that they are able to see what your blood pressures have been at home and make medical decisions based off of this data.   Review the information in your discharge paperwork regarding the DASH diet to lower blood pressure.  Limit the amount of salt in your diet as this can help to lower your blood pressure as well.  If you develop any new or worsening symptoms or do not improve in the next 2 to 3 days, please return.  If your symptoms are severe, please go to the emergency room.  Follow-up with your primary care provider for further evaluation and management of your symptoms as well as ongoing wellness visits.  I hope you feel better!

## 2022-10-12 NOTE — ED Triage Notes (Signed)
Patient having vaginal itching internally onset 1 week.   No burning, no rash, no urinary symptoms. No vaginal discharge. No low back/abdominal pain.

## 2022-10-15 ENCOUNTER — Telehealth (HOSPITAL_COMMUNITY): Payer: Self-pay | Admitting: Emergency Medicine

## 2022-10-15 LAB — CERVICOVAGINAL ANCILLARY ONLY
Bacterial Vaginitis (gardnerella): POSITIVE — AB
Candida Glabrata: NEGATIVE
Candida Vaginitis: NEGATIVE
Chlamydia: NEGATIVE
Comment: NEGATIVE
Comment: NEGATIVE
Comment: NEGATIVE
Comment: NEGATIVE
Comment: NEGATIVE
Comment: NORMAL
Neisseria Gonorrhea: NEGATIVE
Trichomonas: POSITIVE — AB

## 2022-10-15 MED ORDER — METRONIDAZOLE 500 MG PO TABS: 500.0000 mg | ORAL_TABLET | Freq: Two times a day (BID) | ORAL | 0 refills | Status: DC

## 2023-06-17 ENCOUNTER — Ambulatory Visit (HOSPITAL_COMMUNITY)
Admission: EM | Admit: 2023-06-17 | Discharge: 2023-06-17 | Disposition: A | Discharge status: Home / Self Care | Payer: Medicaid Other | Attending: Nurse Practitioner | Admitting: Nurse Practitioner

## 2023-06-17 ENCOUNTER — Telehealth (HOSPITAL_COMMUNITY): Payer: Self-pay

## 2023-06-17 ENCOUNTER — Encounter (HOSPITAL_COMMUNITY): Payer: Self-pay | Admitting: Emergency Medicine

## 2023-06-17 DIAGNOSIS — K1370 Unspecified lesions of oral mucosa: Secondary | ICD-10-CM | POA: Diagnosis not present

## 2023-06-17 DIAGNOSIS — J029 Acute pharyngitis, unspecified: Secondary | ICD-10-CM | POA: Diagnosis not present

## 2023-06-17 DIAGNOSIS — N898 Other specified noninflammatory disorders of vagina: Secondary | ICD-10-CM | POA: Diagnosis not present

## 2023-06-17 LAB — POCT RAPID STREP A (OFFICE): Rapid Strep A Screen: NEGATIVE

## 2023-06-17 MED ORDER — LIDOCAINE VISCOUS HCL 2 % MT SOLN: 15.0000 mL | OROMUCOSAL | 0 refills | Status: DC | PRN

## 2023-06-17 MED ORDER — FLUCONAZOLE 150 MG PO TABS: 150.0000 mg | ORAL_TABLET | Freq: Once | ORAL | 0 refills | Status: AC

## 2023-06-17 MED ORDER — VALACYCLOVIR HCL 1 G PO TABS: 1000.0000 mg | ORAL_TABLET | Freq: Two times a day (BID) | ORAL | 0 refills | Status: AC

## 2023-06-17 NOTE — Discharge Instructions (Addendum)
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  We will contact you if any other testing is positive later this week.   Take the Valtrex as prescribed to treat the herpetic lesions in your mouth and in your groin.  Take the fluconazole to help with the itchy discharge.    Recommend condom use with every sexual encounter moving forward to prevent STI.

## 2023-06-17 NOTE — ED Triage Notes (Signed)
Pt reports that she feels like a pill or something stuck in her throat and painful to swallow or cough for 3-4 days.   Pt adds that she had unprotected sex last week and for past 3-4 days having vaginal itching and rash.

## 2023-06-17 NOTE — ED Provider Notes (Addendum)
[facility]    CSN: [id number] Arrival date & time: 06/17/23  1049      History   Chief Complaint Chief Complaint  Patient presents with   Vaginal Itching   feels like something stuck in throat    HPI Emma Jacobs is a 27 y.o. female.   Patient presents today with 3 to 4-day history of sore throat, feels like there is something in the back of her throat when she swallows.  She denies fever, body aches or chills, cough, shortness of breath or chest pain, runny or stuffy nose, postnasal drainage, sinus pressure or headache, ear pain, abdominal pain, nausea/vomiting, and diarrhea.  Reports appetite has been decreased because it hurts to swallow.  Has tried salt water gargles without improvement in symptoms.  Patient is also concerned about vaginal itching and irritation that has been ongoing for the same amount of time.  Reports that she had unprotected sex last week and symptoms began shortly thereafter.  Reports she had oral and vaginal sex.  She denies vaginal discharge, but does have a slight not fishy odor and "something feels off."  No abdominal pain or pain in the groin or swelling in the groin.  No vaginal bleeding.  Reports she has painful bumps that are a little bit itchy on the inside of both groins going down to the vagina.  No known exposures to STI.  LMP: 05/30/2023.  Patient declines HIV/RPR testing today.  No history of STI per her report.     Past Medical History:  Diagnosis Date   Asthma    currently uses inhaler   Gestational hypertension     Patient Active Problem List   Diagnosis Date Noted   Cesarean delivery delivered 01/25/2014   Labor and delivery indication for care or intervention 01/24/2014   Pregnancy 12/25/2013   Gestational hypertension 12/15/2013   Pyelectasis of fetus on prenatal ultrasound 09/18/2013   Teen pregnancy 08/11/2013   Family history of diabetes mellitus (DM) 08/11/2013   Obesity 08/11/2013    Past Surgical  History:  Procedure Laterality Date   CESAREAN SECTION N/A 01/25/2014   Procedure: CESAREAN SECTION;  Surgeon: Antionette Char, MD;  Location: [facility];  Service: Obstetrics;  Laterality: N/A;   NO PAST SURGERIES      OB History     Gravida  1   Para  1   Term  1   Preterm      AB      Living  1      SAB      IAB      Ectopic      Multiple      Live Births  1            Home Medications    Prior to Admission medications   Medication Sig Start Date End Date Taking? Authorizing Provider  fluconazole (DIFLUCAN) 150 MG tablet Take 1 tablet (150 mg total) by mouth once for 1 dose. 06/17/23 06/17/23 Yes Cathlean Marseilles A, NP  lidocaine (XYLOCAINE) 2 % solution Use as directed 15 mLs in the mouth or throat every 3 (three) hours as needed for mouth pain. Gargle and spit as needed for throat pain 06/17/23  Yes Cathlean Marseilles A, NP  valACYclovir (VALTREX) 1000 MG tablet Take 1 tablet (1,000 mg total) by mouth 2 (two) times daily for 10 days. 06/17/23 06/27/23 Yes Cathlean Marseilles A, NP  amLODipine (NORVASC) 5 MG tablet Take 1 tablet (5 mg total) by mouth  daily. 10/12/22   Carlisle Beers, FNP  Blood Pressure Monitoring (BLOOD PRESSURE CUFF) MISC Take blood pressure once daily for 2 weeks, then 2-3 times a week.  Write numbers down in a notebook. 10/12/22   Carlisle Beers, FNP  famotidine (PEPCID) 20 MG tablet Take 1 tablet (20 mg total) by mouth 2 (two) times daily. 02/20/21   Khatri, Hina, PA-C  ibuprofen (ADVIL) 800 MG tablet Take 1 tablet (800 mg total) by mouth 3 (three) times daily with meals. Patient not taking: Reported on 08/29/2020 07/22/19   Mardella Layman, MD    Family History Family History  Problem Relation Age of Onset   Hypertension Maternal Grandmother    Diabetes Maternal Grandmother     Social History Social History   Tobacco Use   Smoking status: Every Day    Types: Cigars   Smokeless tobacco: Never  Vaping Use   Vaping status:  Every Day  Substance Use Topics   Alcohol use: Not Currently   Drug use: Yes    Types: Marijuana     Allergies   Patient has no known allergies.   Review of Systems Review of Systems Per HPI  Physical Exam Triage Vital Signs ED Triage Vitals [06/17/23 1215]  Encounter Vitals Group     BP (!) 157/100     Systolic BP Percentile      Diastolic BP Percentile      Pulse Rate 81     Resp 18     Temp 99.5 F (37.5 C)     Temp Source Oral     SpO2 98 %     Weight      Height      Head Circumference      Peak Flow      Pain Score      Pain Loc      Pain Education      Exclude from Growth Chart    No data found.  Updated Vital Signs BP (!) 157/100 (BP Location: Left Arm)   Pulse 81   Temp 99.5 F (37.5 C) (Oral)   Resp 18   LMP 06/01/2023   SpO2 98%   Visual Acuity Right Eye Distance:   Left Eye Distance:   Bilateral Distance:    Right Eye Near:   Left Eye Near:    Bilateral Near:     Physical Exam Vitals and nursing note reviewed. Exam conducted with a chaperone present (Demetrice [place], CMA).  Constitutional:      General: She is not in acute distress.    Appearance: Normal appearance. She is not toxic-appearing.  HENT:     Right Ear: Tympanic membrane, ear canal and external ear normal.     Left Ear: Tympanic membrane, ear canal and external ear normal. There is no impacted cerumen.     Nose: Nose normal. No rhinorrhea.     Mouth/Throat:     Mouth: Mucous membranes are moist. Oral lesions present.     Pharynx: Pharyngeal swelling, posterior oropharyngeal erythema and uvula swelling present.     Tonsils: No tonsillar exudate. 1+ on the right. 1+ on the left.     Comments: Circular, cluster lesions noted to posterior pharynx and uvula Eyes:     General: No scleral icterus.    Extraocular Movements: Extraocular movements intact.  Pulmonary:     Effort: Pulmonary effort is normal. No respiratory distress.  Genitourinary:    Exam position: Lithotomy  position.     Pubic  Area: Rash present.     Vagina: Vaginal discharge present.     Cervix: Normal. No cervical motion tenderness.     Comments: Thin, white, malodorous discharge  Clustered circular lesions noted to bilateral groin Lymphadenopathy:     Lower Body: No right inguinal adenopathy. No left inguinal adenopathy.  Skin:    General: Skin is warm and dry.     Coloration: Skin is not jaundiced or pale.     Findings: No erythema.  Neurological:     Mental Status: She is alert and oriented to person, place, and time.     Motor: No weakness.     Gait: Gait normal.  Psychiatric:        Behavior: Behavior is cooperative.      UC Treatments / Results  Labs (all labs ordered are listed, but only abnormal results are displayed) Labs Reviewed  HSV CULTURE AND TYPING  POCT RAPID STREP A (OFFICE)  CERVICOVAGINAL ANCILLARY ONLY  CYTOLOGY, (ORAL, ANAL, URETHRAL) ANCILLARY ONLY    EKG   Radiology No results found.  Procedures Procedures (including critical care time)  Medications Ordered in UC Medications - No data to display  Initial Impression / Assessment and Plan / UC Course  I have reviewed the triage vital signs and the nursing notes.  Pertinent labs & imaging results that were available during my care of the patient were reviewed by me and considered in my medical decision making (see chart for details).   Patient is well-appearing, normotensive, afebrile, not tachycardic, not tachypneic, oxygenating well on room air.    1. Acute pharyngitis, unspecified etiology 2. Oral lesion Rapid strep test is negative Examination is concerning for herpetic cause HSV testing obtained Cytology also obtained to check for trichomonas, gonorrhea, chlamydia of the oropharynx Start Valtrex, lidocaine rinses in the meantime  3. Vaginal lesion 4. 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  All questions answered to their satisfaction.  The patient is in agreement to this plan.    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  We will contact you if any other testing is positive later this week.   Take the Valtrex as prescribed to treat the herpetic lesions in your mouth and in your groin.  Take the fluconazole to help with the itchy discharge.    Recommend condom use with every sexual encounter moving forward to prevent STI.     ED Prescriptions     Medication Sig Dispense Auth. Provider   valACYclovir (VALTREX) 1000 MG tablet Take 1 tablet (1,000 mg total) by mouth 2 (two) times daily for 10 days. 20 tablet Cathlean Marseilles A, NP   fluconazole (DIFLUCAN) 150 MG tablet Take 1 tablet (150 mg total) by mouth once for 1 dose. 1 tablet Cathlean Marseilles A, NP   lidocaine (XYLOCAINE) 2 % solution Use as directed 15 mLs in the mouth or throat every 3 (three) hours as needed for mouth pain. Gargle and spit as needed for  throat pain 100 mL Valentino Nose, NP      PDMP not reviewed this encounter.   Valentino Nose, NP 06/17/23 1719    Valentino Nose, NP 06/17/23 1719

## 2023-06-18 LAB — CERVICOVAGINAL ANCILLARY ONLY
Bacterial Vaginitis (gardnerella): POSITIVE — AB
Candida Glabrata: NEGATIVE
Candida Vaginitis: POSITIVE — AB
Chlamydia: NEGATIVE
Comment: NEGATIVE
Comment: NEGATIVE
Comment: NEGATIVE
Comment: NEGATIVE
Comment: NEGATIVE
Comment: NORMAL
Neisseria Gonorrhea: NEGATIVE
Trichomonas: POSITIVE — AB

## 2023-06-19 ENCOUNTER — Telehealth (HOSPITAL_COMMUNITY): Payer: Self-pay | Admitting: Emergency Medicine

## 2023-06-19 LAB — HSV CULTURE AND TYPING

## 2023-06-19 LAB — CYTOLOGY, (ORAL, ANAL, URETHRAL) ANCILLARY ONLY
Chlamydia: NEGATIVE
Comment: NEGATIVE
Comment: NEGATIVE
Comment: NORMAL
Neisseria Gonorrhea: NEGATIVE
Trichomonas: NEGATIVE

## 2023-06-19 LAB — HSV CULTURE, NO TYPING: HSV Culture Without Typing: NEGATIVE

## 2023-06-19 MED ORDER — FLUCONAZOLE 150 MG PO TABS: 150.0000 mg | ORAL_TABLET | Freq: Once | ORAL | 0 refills | Status: AC

## 2023-06-19 MED ORDER — METRONIDAZOLE 500 MG PO TABS: 500.0000 mg | ORAL_TABLET | Freq: Two times a day (BID) | ORAL | 0 refills | Status: DC

## 2023-07-09 ENCOUNTER — Ambulatory Visit (HOSPITAL_COMMUNITY)
Admission: RE | Admit: 2023-07-09 | Discharge: 2023-07-09 | Disposition: A | Discharge status: Home / Self Care | Payer: Medicaid Other | Source: Ambulatory Visit | Attending: Nurse Practitioner | Admitting: Nurse Practitioner

## 2023-07-09 ENCOUNTER — Encounter (HOSPITAL_COMMUNITY): Payer: Self-pay

## 2023-07-09 VITALS — BP 146/98 | HR 82 | Temp 97.8°F | Resp 16

## 2023-07-09 DIAGNOSIS — Z3202 Encounter for pregnancy test, result negative: Secondary | ICD-10-CM

## 2023-07-09 DIAGNOSIS — R739 Hyperglycemia, unspecified: Secondary | ICD-10-CM

## 2023-07-09 DIAGNOSIS — N898 Other specified noninflammatory disorders of vagina: Secondary | ICD-10-CM | POA: Diagnosis not present

## 2023-07-09 DIAGNOSIS — R81 Glycosuria: Secondary | ICD-10-CM | POA: Diagnosis not present

## 2023-07-09 LAB — COMPREHENSIVE METABOLIC PANEL
ALT: 15 U/L (ref 0–44)
AST: 10 U/L — ABNORMAL LOW (ref 15–41)
Albumin: 3.5 g/dL (ref 3.5–5.0)
Alkaline Phosphatase: 103 U/L (ref 38–126)
Anion gap: 10 (ref 5–15)
BUN: 8 mg/dL (ref 6–20)
CO2: 25 mmol/L (ref 22–32)
Calcium: 9.8 mg/dL (ref 8.9–10.3)
Chloride: 101 mmol/L (ref 98–111)
Creatinine, Ser: 0.55 mg/dL (ref 0.44–1.00)
GFR, Estimated: >60 mL/min (ref >60)
Glucose, Bld: 298 mg/dL — ABNORMAL HIGH (ref 70–99)
Potassium: 4.5 mmol/L (ref 3.5–5.1)
Sodium: 136 mmol/L (ref 135–145)
Total Bilirubin: 0.5 mg/dL (ref 0.3–1.2)
Total Protein: 7.4 g/dL (ref 6.5–8.1)

## 2023-07-09 LAB — POCT URINALYSIS DIP (MANUAL ENTRY)
Bilirubin, UA: NEGATIVE
Glucose, UA: >=1000 mg/dL — AB
Ketones, POC UA: NEGATIVE mg/dL
Leukocytes, UA: NEGATIVE
Nitrite, UA: NEGATIVE
Protein Ur, POC: NEGATIVE mg/dL
Spec Grav, UA: 1.02 (ref 1.010–1.025)
Urobilinogen, UA: 0.2 E.U./dL
pH, UA: 6 (ref 5.0–8.0)

## 2023-07-09 LAB — HEMOGLOBIN A1C
Hgb A1c MFr Bld: 11.7 % — ABNORMAL HIGH (ref 4.8–5.6)
Mean Plasma Glucose: 289.09 mg/dL

## 2023-07-09 LAB — HIV ANTIBODY (ROUTINE TESTING W REFLEX): HIV Screen 4th Generation wRfx: NONREACTIVE

## 2023-07-09 LAB — POCT FASTING CBG KUC MANUAL ENTRY: POCT Glucose (KUC): 300 mg/dL — AB (ref 70–99)

## 2023-07-09 LAB — POCT URINE PREGNANCY: Preg Test, Ur: NEGATIVE

## 2023-07-09 MED ORDER — FLUCONAZOLE 150 MG PO TABS: 150.0000 mg | ORAL_TABLET | Freq: Once | ORAL | 0 refills | Status: AC

## 2023-07-09 NOTE — Discharge Instructions (Addendum)
Please take the fluconazole as prescribed to treat yeast infection.  We will contact you if any other results are positive and will prescribe treatment at that time.   The urine test today shows quite a bit of sugar.  The blood sugar today was 300 which is elevated.  We are checking more blood work and will contact you with results tomorrow.  Please schedule a follow up with your PCP as soon as able to disucss management of diabetes.

## 2023-07-09 NOTE — ED Triage Notes (Signed)
Pt presents with vaginal irritation for several days.

## 2023-07-09 NOTE — ED Provider Notes (Signed)
[facility]    CSN: [id number] Arrival date & time: 07/09/23  1242      History   Chief Complaint Chief Complaint  Patient presents with   Vaginal Itching    HPI Emma Jacobs is a 27 y.o. female.   Patient presents today for 1 week history of vaginal itching and irritation.  Reports she has been scratching a lot and thinks there are couple of sores on her genitalia.  No burning with urination, increased urinary frequency, or urgency.  No abdominal or pelvic pain.  She reports there is a thick, clumpy vaginal discharge that is malodorous.  Reports that she was treated about 1 month ago for BV, yeast infection, and trichomoniasis and symptoms never fully improved.  She is unsure if her partner ever got treated.  No fever or nausea/vomiting.  Patient reports she has a history of hypertension and high blood pressure, however no history of diabetes.  She does report a family history of diabetes and has been told in the past that she is at risk for diabetes.    Past Medical History:  Diagnosis Date   Asthma    currently uses inhaler   Gestational hypertension     Patient Active Problem List   Diagnosis Date Noted   Cesarean delivery delivered 01/25/2014   Labor and delivery indication for care or intervention 01/24/2014   Pregnancy 12/25/2013   Gestational hypertension 12/15/2013   Pyelectasis of fetus on prenatal ultrasound 09/18/2013   Teen pregnancy 08/11/2013   Family history of diabetes mellitus (DM) 08/11/2013   Obesity 08/11/2013    Past Surgical History:  Procedure Laterality Date   CESAREAN SECTION N/A 01/25/2014   Procedure: CESAREAN SECTION;  Surgeon: Antionette Char, MD;  Location: [facility];  Service: Obstetrics;  Laterality: N/A;   NO PAST SURGERIES      OB History     Gravida  1   Para  1   Term  1   Preterm      AB      Living  1      SAB      IAB      Ectopic      Multiple      Live Births  1            Home  Medications    Prior to Admission medications   Medication Sig Start Date End Date Taking? Authorizing Provider  fluconazole (DIFLUCAN) 150 MG tablet Take 1 tablet (150 mg total) by mouth once for 1 dose. 07/09/23 07/09/23 Yes Cathlean Marseilles A, NP  amLODipine (NORVASC) 5 MG tablet Take 1 tablet (5 mg total) by mouth daily. 10/12/22   Carlisle Beers, FNP  Blood Pressure Monitoring (BLOOD PRESSURE CUFF) MISC Take blood pressure once daily for 2 weeks, then 2-3 times a week.  Write numbers down in a notebook. 10/12/22   Carlisle Beers, FNP  famotidine (PEPCID) 20 MG tablet Take 1 tablet (20 mg total) by mouth 2 (two) times daily. 02/20/21   Khatri, Hina, PA-C  ibuprofen (ADVIL) 800 MG tablet Take 1 tablet (800 mg total) by mouth 3 (three) times daily with meals. Patient not taking: Reported on 08/29/2020 07/22/19   Mardella Layman, MD  lidocaine (XYLOCAINE) 2 % solution Use as directed 15 mLs in the mouth or throat every 3 (three) hours as needed for mouth pain. Gargle and spit as needed for throat pain 06/17/23   Valentino Nose, NP  metroNIDAZOLE (FLAGYL)  500 MG tablet Take 1 tablet (500 mg total) by mouth 2 (two) times daily. 06/19/23   Lamptey, Britta Mccreedy, MD    Family History Family History  Problem Relation Age of Onset   Hypertension Maternal Grandmother    Diabetes Maternal Grandmother     Social History Social History   Tobacco Use   Smoking status: Every Day    Types: Cigars   Smokeless tobacco: Never  Vaping Use   Vaping status: Every Day  Substance Use Topics   Alcohol use: Not Currently   Drug use: Yes    Types: Marijuana     Allergies   Patient has no known allergies.   Review of Systems Review of Systems Per HPI  Physical Exam Triage Vital Signs ED Triage Vitals [07/09/23 1520]  Encounter Vitals Group     BP (!) 146/98     Systolic BP Percentile      Diastolic BP Percentile      Pulse Rate 82     Resp 16     Temp 97.8 F (36.6 C)     Temp  Source Oral     SpO2 98 %     Weight      Height      Head Circumference      Peak Flow      Pain Score      Pain Loc      Pain Education      Exclude from Growth Chart    No data found.  Updated Vital Signs BP (!) 146/98 (BP Location: Left Arm)   Pulse 82   Temp 97.8 F (36.6 C) (Oral)   Resp 16   LMP 06/01/2023   SpO2 98%   Visual Acuity Right Eye Distance:   Left Eye Distance:   Bilateral Distance:    Right Eye Near:   Left Eye Near:    Bilateral Near:     Physical Exam Vitals and nursing note reviewed.  Constitutional:      General: She is not in acute distress.    Appearance: Normal appearance. She is not toxic-appearing.  Pulmonary:     Effort: Pulmonary effort is normal. No respiratory distress.  Genitourinary:    General: Normal vulva.     Exam position: Lithotomy position.     Pubic Area: No rash.      Comments: Thick, clumpy white/yellow discharge noted to external genitalia; discharge is malodorous.  There are tiny pustules on the vulva. Lymphadenopathy:     Lower Body: No right inguinal adenopathy. No left inguinal adenopathy.  Skin:    General: Skin is warm and dry.     Coloration: Skin is not jaundiced or pale.     Findings: No erythema.  Neurological:     Mental Status: She is alert and oriented to person, place, and time.     Motor: No weakness.     Gait: Gait normal.  Psychiatric:        Behavior: Behavior is cooperative.      UC Treatments / Results  Labs (all labs ordered are listed, but only abnormal results are displayed) Labs Reviewed  POCT URINALYSIS DIP (MANUAL ENTRY) - Abnormal; Notable for the following components:      Result Value   Glucose, UA >=1,000 (*)    Blood, UA trace-intact (*)    All other components within normal limits  POCT FASTING CBG [facility] MANUAL ENTRY - Abnormal; Notable for the following components:   POCT Glucose ([facility])  300 (*)    All other components within normal limits  HIV ANTIBODY (ROUTINE TESTING W  REFLEX)  RPR  COMPREHENSIVE METABOLIC PANEL  HEMOGLOBIN A1C  POCT URINE PREGNANCY  CERVICOVAGINAL ANCILLARY ONLY    EKG   Radiology No results found.  Procedures Procedures (including critical care time)  Medications Ordered in UC Medications - No data to display  Initial Impression / Assessment and Plan / UC Course  I have reviewed the triage vital signs and the nursing notes.  Pertinent labs & imaging results that were available during my care of the patient were reviewed by me and considered in my medical decision making (see chart for details).   Patient is well-appearing, normotensive, afebrile, not tachycardic, not tachypneic, oxygenating well on room air.    1. Vaginal discharge Symptoms consistent with yeast infection, also suspicious for trichomoniasis as she does not know if her partner was treated Vaginal self swab is pending Will treat for yeast infection with fluconazole until results have returned Safe sex practices recommended including condom use  2. Urine pregnancy test negative  3. Glucosuria 4. Hyperglycemia  Urinalysis shows greater than 1000 glucose, no ketones Nonfasting blood sugar is 300; discussed results with patient and informed her that she likely has diabetes Without ketones in urine, no need for emergent evaluation unless symptoms develop CMP, HgbA1c obtained to further evaluate Recommended close follow up with PCP and patient verbalized understanding  The patient was given the opportunity to ask questions.  All questions answered to their satisfaction.  The patient is in agreement to this plan.    Final Clinical Impressions(s) / UC Diagnoses   Final diagnoses:  Vaginal discharge  Urine pregnancy test negative  Glucosuria  Hyperglycemia     Discharge Instructions      Please take the fluconazole as prescribed to treat yeast infection.  We will contact you if any other results are positive and will prescribe treatment at that  time.   The urine test today shows quite a bit of sugar.  The blood sugar today was 300 which is elevated.  We are checking more blood work and will contact you with results tomorrow.  Please schedule a follow up with your PCP as soon as able to disucss management of diabetes.     ED Prescriptions     Medication Sig Dispense Auth. Provider   fluconazole (DIFLUCAN) 150 MG tablet Take 1 tablet (150 mg total) by mouth once for 1 dose. 1 tablet Valentino Nose, NP      PDMP not reviewed this encounter.   Valentino Nose, NP 07/09/23 [phone]

## 2023-07-11 MED ORDER — DOXYCYCLINE HYCLATE 100 MG PO CAPS: 100.0000 mg | ORAL_CAPSULE | Freq: Two times a day (BID) | ORAL | 0 refills | Status: DC

## 2023-07-11 NOTE — Telephone Encounter (Signed)
Pt requires tx with Doxycycline. Attempted to reach patient x1. LVM. Rx sent to pharmacy on file.

## 2023-09-05 ENCOUNTER — Ambulatory Visit (HOSPITAL_COMMUNITY)
Admission: RE | Admit: 2023-09-05 | Discharge: 2023-09-05 | Disposition: A | Discharge status: Home / Self Care | Payer: Medicaid Other | Source: Ambulatory Visit | Attending: Internal Medicine | Admitting: Internal Medicine

## 2023-09-05 ENCOUNTER — Encounter (HOSPITAL_COMMUNITY): Payer: Self-pay

## 2023-09-05 VITALS — BP 144/94 | HR 97 | Temp 98.9°F | Resp 16

## 2023-09-05 DIAGNOSIS — J069 Acute upper respiratory infection, unspecified: Secondary | ICD-10-CM | POA: Diagnosis not present

## 2023-09-05 DIAGNOSIS — N760 Acute vaginitis: Secondary | ICD-10-CM | POA: Insufficient documentation

## 2023-09-05 DIAGNOSIS — Z1152 Encounter for screening for COVID-19: Secondary | ICD-10-CM | POA: Insufficient documentation

## 2023-09-05 LAB — SARS CORONAVIRUS 2 (TAT 6-24 HRS): SARS Coronavirus 2: NEGATIVE

## 2023-09-05 MED ORDER — BENZONATATE 100 MG PO CAPS: 100.0000 mg | ORAL_CAPSULE | Freq: Three times a day (TID) | ORAL | 0 refills | Status: DC

## 2023-09-05 MED ORDER — FLUCONAZOLE 150 MG PO TABS: 150.0000 mg | ORAL_TABLET | ORAL | 0 refills | Status: DC

## 2023-09-05 MED ORDER — GUAIFENESIN ER 1200 MG PO TB12: 1200.0000 mg | ORAL_TABLET | Freq: Two times a day (BID) | ORAL | 0 refills | Status: DC

## 2023-09-05 MED ORDER — PROMETHAZINE-DM 6.25-15 MG/5ML PO SYRP: 5.0000 mL | ORAL_SOLUTION | Freq: Every evening | ORAL | 0 refills | Status: DC | PRN

## 2023-09-05 NOTE — Discharge Instructions (Addendum)
You have a viral illness which will improve on its own with rest, fluids, and medications to help with your symptoms. We discussed prescriptions that may help with your symptoms: tessalon perles, promethazine DM You may use over the counter medicines as needed: tylenol/motrin, mucinex, zyrtec, Flonase Two teaspoons of honey in 1 cup of warm water every 4-6 hours may help with throat pains. Humidifier in room at nighttime may help soothe cough (clean well daily).   For chest pain, shortness of breath, inability to keep food or fluids down without vomiting, fever that does not respond to tylenol or motrin, or any other severe symptoms, please go to the ER for further evaluation. Return to urgent care as needed, otherwise follow-up with PCP.    I suspect that your  symptoms are due to suspected vaginal yeast infection.  Take medication as prescribed (diflucan)  Your swab has been sent for testing, staff will call you in 2-3 days if you test positive for any other infections and will provide treatment at that time.  Return to urgent care as needed and follow-up with your primary care provider for further evaluation and management of your symptoms..  I hope you feel better!

## 2023-09-05 NOTE — ED Provider Notes (Signed)
[facility]    CSN: [id number] Arrival date & time: 09/05/23  1120      History   Chief Complaint Chief Complaint  Patient presents with   Vaginal Itching    Itching, and, white clumpy vaginal discharge seemingly a yeast infection - Entered by patient    HPI Emma Jacobs is a 27 y.o. female.   Emma Jacobs is a 27 y.o. female presenting for chief complaint of cough, nasal congestion, sore throat, and body aches/sweats that started on Tuesday September 03, 2023 (3 days ago). Reports generalized fatigue, weakness, and decreased appetite as well. Cough is dry and nonproductive. Sore throat is worsened by swallowing. She is unsure of fever at home but reports chills. History of asthma, she has not needed to use her albuterol inhaler during this illness. No SOB, CP, N/V/D, abdominal pain, or rash. Intermittent marijuana and cigar smoker, denies other drug use. Not taking any OTC medicines to help with symptoms.   Also complains of thick clumpy white vaginal discharge that started a few days ago. Denies vaginal odor but reports significant vaginal itching. Symptoms feel similar to the last time she had a vaginal yeast infection. No concerns for STD. No recent antibiotics.   Vaginal Itching    Past Medical History:  Diagnosis Date   Asthma    currently uses inhaler   Gestational hypertension     Patient Active Problem List   Diagnosis Date Noted   Cesarean delivery delivered 01/25/2014   Labor and delivery indication for care or intervention 01/24/2014   Pregnancy 12/25/2013   Gestational hypertension 12/15/2013   Pyelectasis of fetus on prenatal ultrasound 09/18/2013   Teen pregnancy 08/11/2013   Family history of diabetes mellitus (DM) 08/11/2013   Obesity 08/11/2013    Past Surgical History:  Procedure Laterality Date   CESAREAN SECTION N/A 01/25/2014   Procedure: CESAREAN SECTION;  Surgeon: Antionette Char, MD;  Location: [facility];  Service: Obstetrics;   Laterality: N/A;   NO PAST SURGERIES      OB History     Gravida  1   Para  1   Term  1   Preterm      AB      Living  1      SAB      IAB      Ectopic      Multiple      Live Births  1            Home Medications    Prior to Admission medications   Medication Sig Start Date End Date Taking? Authorizing Provider  benzonatate (TESSALON) 100 MG capsule Take 1 capsule (100 mg total) by mouth every 8 (eight) hours. 09/05/23  Yes Carlisle Beers, FNP  fluconazole (DIFLUCAN) 150 MG tablet Take 1 tablet (150 mg total) by mouth every 3 (three) days. 09/05/23  Yes Carlisle Beers, FNP  Guaifenesin 1200 MG TB12 Take 1 tablet (1,200 mg total) by mouth in the morning and at bedtime. 09/05/23  Yes Carlisle Beers, FNP  promethazine-dextromethorphan (PROMETHAZINE-DM) 6.25-15 MG/5ML syrup Take 5 mLs by mouth at bedtime as needed for cough. 09/05/23  Yes Carlisle Beers, FNP  amLODipine (NORVASC) 5 MG tablet Take 1 tablet (5 mg total) by mouth daily. Patient not taking: Reported on 09/05/2023 10/12/22   Carlisle Beers, FNP  Blood Pressure Monitoring (BLOOD PRESSURE CUFF) MISC Take blood pressure once daily for 2 weeks, then 2-3 times a week.  Write  numbers down in a notebook. 10/12/22   Carlisle Beers, FNP  doxycycline (VIBRAMYCIN) 100 MG capsule Take 1 capsule (100 mg total) by mouth 2 (two) times daily. Patient not taking: Reported on 09/05/2023 07/11/23   Merrilee Jansky, MD  famotidine (PEPCID) 20 MG tablet Take 1 tablet (20 mg total) by mouth 2 (two) times daily. Patient not taking: Reported on 09/05/2023 02/20/21   Dietrich Pates, PA-C  ibuprofen (ADVIL) 800 MG tablet Take 1 tablet (800 mg total) by mouth 3 (three) times daily with meals. Patient not taking: Reported on 08/29/2020 07/22/19   Mardella Layman, MD  lidocaine (XYLOCAINE) 2 % solution Use as directed 15 mLs in the mouth or throat every 3 (three) hours as needed for mouth pain. Gargle and  spit as needed for throat pain Patient not taking: Reported on 09/05/2023 06/17/23   Valentino Nose, NP  metroNIDAZOLE (FLAGYL) 500 MG tablet Take 1 tablet (500 mg total) by mouth 2 (two) times daily. Patient not taking: Reported on 09/05/2023 06/19/23   Lamptey, Britta Mccreedy, MD    Family History Family History  Problem Relation Age of Onset   Hypertension Maternal Grandmother    Diabetes Maternal Grandmother     Social History Social History   Tobacco Use   Smoking status: Every Day    Types: Cigars   Smokeless tobacco: Never  Vaping Use   Vaping status: Former  Substance Use Topics   Alcohol use: Yes    Comment: wine occasionally   Drug use: Not Currently    Types: Marijuana     Allergies   Patient has no known allergies.   Review of Systems Review of Systems Per HPI  Physical Exam Triage Vital Signs ED Triage Vitals  Encounter Vitals Group     BP 09/05/23 1156 (!) 144/94     Systolic BP Percentile --      Diastolic BP Percentile --      Pulse Rate 09/05/23 1156 97     Resp 09/05/23 1156 16     Temp 09/05/23 1156 98.9 F (37.2 C)     Temp Source 09/05/23 1156 Oral     SpO2 09/05/23 1156 100 %     Weight --      Height --      Head Circumference --      Peak Flow --      Pain Score 09/05/23 1153 3     Pain Loc --      Pain Education --      Exclude from Growth Chart --    No data found.  Updated Vital Signs BP (!) 144/94 (BP Location: Left Arm)   Pulse 97   Temp 98.9 F (37.2 C) (Oral)   Resp 16   LMP 08/11/2023 (Approximate)   SpO2 100%   Visual Acuity Right Eye Distance:   Left Eye Distance:   Bilateral Distance:    Right Eye Near:   Left Eye Near:    Bilateral Near:     Physical Exam Vitals and nursing note reviewed.  Constitutional:      Appearance: She is ill-appearing. She is not toxic-appearing.  HENT:     Head: Normocephalic and atraumatic.     Right Ear: Hearing, tympanic membrane, ear canal and external ear normal.      Left Ear: Hearing, tympanic membrane, ear canal and external ear normal.     Nose: Congestion present.     Mouth/Throat:     Lips: Pink.  Mouth: Mucous membranes are moist. No injury.     Tongue: No lesions. Tongue does not deviate from midline.     Palate: No mass and lesions.     Pharynx: Oropharynx is clear. Uvula midline. Posterior oropharyngeal erythema present. No pharyngeal swelling, oropharyngeal exudate or uvula swelling.     Tonsils: No tonsillar exudate or tonsillar abscesses.  Eyes:     General: Lids are normal. Vision grossly intact. Gaze aligned appropriately.     Extraocular Movements: Extraocular movements intact.     Conjunctiva/sclera: Conjunctivae normal.  Cardiovascular:     Rate and Rhythm: Normal rate and regular rhythm.     Heart sounds: Normal heart sounds, S1 normal and S2 normal.  Pulmonary:     Effort: Pulmonary effort is normal. No respiratory distress.     Breath sounds: Normal breath sounds and air entry. No wheezing, rhonchi or rales.  Chest:     Chest wall: No tenderness.  Genitourinary:    Comments: Deferred.  Musculoskeletal:     Cervical back: Neck supple. No tenderness.     Right lower leg: No edema.     Left lower leg: No edema.  Lymphadenopathy:     Cervical: No cervical adenopathy.  Skin:    General: Skin is warm and dry.     Capillary Refill: Capillary refill takes less than 2 seconds.     Findings: No rash.  Neurological:     General: No focal deficit present.     Mental Status: She is alert and oriented to person, place, and time. Mental status is at baseline.     Cranial Nerves: No dysarthria or facial asymmetry.  Psychiatric:        Mood and Affect: Mood normal.        Speech: Speech normal.        Behavior: Behavior normal.        Thought Content: Thought content normal.        Judgment: Judgment normal.      UC Treatments / Results  Labs (all labs ordered are listed, but only abnormal results are displayed) Labs  Reviewed  SARS CORONAVIRUS 2 (TAT 6-24 HRS)  CERVICOVAGINAL ANCILLARY ONLY    EKG   Radiology No results found.  Procedures Procedures (including critical care time)  Medications Ordered in UC Medications - No data to display  Initial Impression / Assessment and Plan / UC Course  I have reviewed the triage vital signs and the nursing notes.  Pertinent labs & imaging results that were available during my care of the patient were reviewed by me and considered in my medical decision making (see chart for details).   1. Acute vaginitis High clinical suspicion for acute yeast vaginitis, therefore will treat with diflucan empirically.  Vaginal swab pending, will treat for other positive infections per protocol when labs result. She initially stated no concern for STD, however on further discussion, she wishes to have STD testing as well as BV/yeast. Discussed tips to prevent recurrent yeast vaginitis infections. Safe sex precautions discussed.    2. Viral URI with cough Suspect viral URI, viral syndrome. Physical exam findings reassuring, vital signs hemodynamically stable. Low suspicion for pneumonia/acute cardiopulmonary abnormality, therefore deferred imaging of the chest. Advised supportive care, offered prescriptions for symptomatic relief.  Recommend continued use of OTC medications as needed, recommendations discussed with patient/caregiver and outlined in AVS.  Strep/viral testing: COVID testing pending, she may have antiviral if positive due to history of asthma. No signs of  asthma exacerbation currently.   Counseled patient on potential for adverse effects with medications prescribed/recommended today, strict ER and return-to-clinic precautions discussed, patient verbalized understanding.    Final Clinical Impressions(s) / UC Diagnoses   Final diagnoses:  Acute vaginitis  Viral URI with cough     Discharge Instructions      You have a viral illness which will  improve on its own with rest, fluids, and medications to help with your symptoms. We discussed prescriptions that may help with your symptoms: tessalon perles, promethazine DM You may use over the counter medicines as needed: tylenol/motrin, mucinex, zyrtec, Flonase Two teaspoons of honey in 1 cup of warm water every 4-6 hours may help with throat pains. Humidifier in room at nighttime may help soothe cough (clean well daily).   For chest pain, shortness of breath, inability to keep food or fluids down without vomiting, fever that does not respond to tylenol or motrin, or any other severe symptoms, please go to the ER for further evaluation. Return to urgent care as needed, otherwise follow-up with PCP.    I suspect that your  symptoms are due to suspected vaginal yeast infection.  Take medication as prescribed (diflucan)  Your swab has been sent for testing, staff will call you in 2-3 days if you test positive for any other infections and will provide treatment at that time.  Return to urgent care as needed and follow-up with your primary care provider for further evaluation and management of your symptoms..  I hope you feel better!       ED Prescriptions     Medication Sig Dispense Auth. Provider   fluconazole (DIFLUCAN) 150 MG tablet Take 1 tablet (150 mg total) by mouth every 3 (three) days. 2 tablet Carlisle Beers, FNP   benzonatate (TESSALON) 100 MG capsule Take 1 capsule (100 mg total) by mouth every 8 (eight) hours. 21 capsule Reita May M, FNP   Guaifenesin 1200 MG TB12 Take 1 tablet (1,200 mg total) by mouth in the morning and at bedtime. 14 tablet Carlisle Beers, FNP   promethazine-dextromethorphan (PROMETHAZINE-DM) 6.25-15 MG/5ML syrup Take 5 mLs by mouth at bedtime as needed for cough. 118 mL Carlisle Beers, FNP      PDMP not reviewed this encounter.   Carlisle Beers, [place] 09/05/23 1239

## 2023-09-05 NOTE — ED Triage Notes (Signed)
Pt presents with sore throat, congestion, cough and scratchy voice that started Tuesday.  Pt also c/o vaginal discomfort with itching and white discharge that first started Saturday.

## 2023-09-09 LAB — CERVICOVAGINAL ANCILLARY ONLY
Bacterial Vaginitis (gardnerella): NEGATIVE
Candida Glabrata: POSITIVE — AB
Candida Vaginitis: POSITIVE — AB
Chlamydia: NEGATIVE
Comment: NEGATIVE
Comment: NEGATIVE
Comment: NEGATIVE
Comment: NEGATIVE
Comment: NEGATIVE
Comment: NORMAL
Neisseria Gonorrhea: NEGATIVE
Trichomonas: NEGATIVE

## 2024-01-17 ENCOUNTER — Encounter (HOSPITAL_COMMUNITY): Payer: Self-pay

## 2024-01-17 ENCOUNTER — Ambulatory Visit (HOSPITAL_COMMUNITY)
Admission: EM | Admit: 2024-01-17 | Discharge: 2024-01-17 | Disposition: A | Discharge status: Home / Self Care | Payer: Medicaid Other | Attending: Emergency Medicine | Admitting: Emergency Medicine

## 2024-01-17 DIAGNOSIS — N764 Abscess of vulva: Secondary | ICD-10-CM | POA: Diagnosis not present

## 2024-01-17 DIAGNOSIS — N760 Acute vaginitis: Secondary | ICD-10-CM

## 2024-01-17 MED ORDER — SULFAMETHOXAZOLE-TRIMETHOPRIM 800-160 MG PO TABS: 1.0000 | ORAL_TABLET | Freq: Two times a day (BID) | ORAL | 0 refills | Status: AC

## 2024-01-17 MED ORDER — LIDOCAINE-EPINEPHRINE-TETRACAINE (LET) TOPICAL GEL
TOPICAL | Status: AC
  Filled 2024-01-17: qty 3

## 2024-01-17 MED ORDER — FLUCONAZOLE 150 MG PO TABS: ORAL_TABLET | ORAL | 0 refills | Status: DC

## 2024-01-17 MED ORDER — IBUPROFEN 800 MG PO TABS
800.0000 mg | ORAL_TABLET | Freq: Once | ORAL | Status: AC
  Administered 2024-01-17: 800 mg via ORAL

## 2024-01-17 MED ORDER — IBUPROFEN 800 MG PO TABS
ORAL_TABLET | ORAL | Status: AC
  Filled 2024-01-17: qty 1

## 2024-01-17 MED ORDER — LIDOCAINE-EPINEPHRINE-TETRACAINE (LET) TOPICAL GEL
3.0000 mL | Freq: Once | TOPICAL | Status: AC
  Administered 2024-01-17: 3 mL via TOPICAL

## 2024-01-17 NOTE — ED Triage Notes (Addendum)
Pt c/o of a bump that started to grow in the left area of her vagina 2-3 weeks after shaving. She started having irritation at the sight a week ago. Two days ago the site started having redness at the site, swelling, and  pain.   Pt c/o of vaginal itching as well.   Home Interventions: None

## 2024-01-17 NOTE — ED Provider Notes (Signed)
[facility]    CSN: [id number] Arrival date & time: 01/17/24  3474      History   Chief Complaint Chief Complaint  Patient presents with   Skin Problem   Vaginal Itching    HPI Emma Jacobs is a 28 y.o. female.   Patient presents to clinic over concern of a growing bump to her left side of her labia.  She shaved about 3 weeks ago and thought she had a ingrown hair popped up shortly afterwards with a hard firm bump.  She noticed over the past few days it has become more painful to walk and the area has gotten much larger.  She has not had any fevers, no drainage.  Has never had incision and drainage or abscesses of the groin.  Has not tried any medications or interventions at home for her symptoms.  Has been having vaginal itching intermittently for the last three weeks as well. Denies discharge. No odor. No urinary symptoms.   The history is provided by the patient and medical records.    Past Medical History:  Diagnosis Date   Asthma    currently uses inhaler   Gestational hypertension     Patient Active Problem List   Diagnosis Date Noted   Cesarean delivery delivered 01/25/2014   Labor and delivery indication for care or intervention 01/24/2014   Pregnancy 12/25/2013   Gestational hypertension 12/15/2013   Pyelectasis of fetus on prenatal ultrasound 09/18/2013   Teen pregnancy 08/11/2013   Family history of diabetes mellitus (DM) 08/11/2013   Obesity 08/11/2013    Past Surgical History:  Procedure Laterality Date   CESAREAN SECTION N/A 01/25/2014   Procedure: CESAREAN SECTION;  Surgeon: Antionette Char, MD;  Location: [facility];  Service: Obstetrics;  Laterality: N/A;   NO PAST SURGERIES      OB History     Gravida  1   Para  1   Term  1   Preterm      AB      Living  1      SAB      IAB      Ectopic      Multiple      Live Births  1            Home Medications    Prior to Admission medications   Medication Sig  Start Date End Date Taking? Authorizing Provider  fluconazole (DIFLUCAN) 150 MG tablet Take one table today and another in 72 hours if vaginal itching persists. 01/17/24  Yes Rinaldo Ratel, [place] N, FNP  sulfamethoxazole-trimethoprim (BACTRIM DS) 800-160 MG tablet Take 1 tablet by mouth 2 (two) times daily for 7 days. 01/17/24 01/24/24 Yes Rinaldo Ratel, [place] N, FNP  amLODipine (NORVASC) 5 MG tablet Take 1 tablet (5 mg total) by mouth daily. Patient not taking: Reported on 09/05/2023 10/12/22   Carlisle Beers, FNP  Blood Pressure Monitoring (BLOOD PRESSURE CUFF) MISC Take blood pressure once daily for 2 weeks, then 2-3 times a week.  Write numbers down in a notebook. 10/12/22   Carlisle Beers, FNP  doxycycline (VIBRAMYCIN) 100 MG capsule Take 1 capsule (100 mg total) by mouth 2 (two) times daily. Patient not taking: Reported on 09/05/2023 07/11/23   Merrilee Jansky, MD  famotidine (PEPCID) 20 MG tablet Take 1 tablet (20 mg total) by mouth 2 (two) times daily. Patient not taking: Reported on 09/05/2023 02/20/21   Dietrich Pates, PA-C  ibuprofen (ADVIL) 800 MG tablet Take 1  tablet (800 mg total) by mouth 3 (three) times daily with meals. Patient not taking: Reported on 08/29/2020 07/22/19   Mardella Layman, MD  lidocaine (XYLOCAINE) 2 % solution Use as directed 15 mLs in the mouth or throat every 3 (three) hours as needed for mouth pain. Gargle and spit as needed for throat pain Patient not taking: Reported on 09/05/2023 06/17/23   Valentino Nose, NP  metroNIDAZOLE (FLAGYL) 500 MG tablet Take 1 tablet (500 mg total) by mouth 2 (two) times daily. Patient not taking: Reported on 09/05/2023 06/19/23   Lamptey, Britta Mccreedy, MD    Family History Family History  Problem Relation Age of Onset   Hypertension Maternal Grandmother    Diabetes Maternal Grandmother     Social History Social History   Tobacco Use   Smoking status: Some Days    Types: Cigars   Smokeless tobacco: Never  Vaping Use   Vaping  status: Former  Substance Use Topics   Alcohol use: Yes    Comment: wine occasionally   Drug use: Not Currently    Types: Marijuana     Allergies   Other   Review of Systems Review of Systems  Per HPI   Physical Exam Triage Vital Signs ED Triage Vitals  Encounter Vitals Group     BP 01/17/24 0934 (!) 150/108     Systolic BP Percentile --      Diastolic BP Percentile --      Pulse Rate 01/17/24 0929 67     Resp 01/17/24 0929 18     Temp 01/17/24 0929 98.8 F (37.1 C)     Temp Source 01/17/24 0929 Oral     SpO2 01/17/24 0929 97 %     Weight --      Height --      Head Circumference --      Peak Flow --      Pain Score 01/17/24 0928 6     Pain Loc --      Pain Education --      Exclude from Growth Chart --    No data found.  Updated Vital Signs BP (!) 150/108 (BP Location: Left Arm)   Pulse 67   Temp 98.8 F (37.1 C) (Oral)   Resp 18   LMP 12/23/2023 (Approximate)   SpO2 97%   Visual Acuity Right Eye Distance:   Left Eye Distance:   Bilateral Distance:    Right Eye Near:   Left Eye Near:    Bilateral Near:     Physical Exam Vitals and nursing note reviewed. Exam conducted with a chaperone present.  Constitutional:      Appearance: Normal appearance.  HENT:     Head: Normocephalic and atraumatic.     Right Ear: External ear normal.     Left Ear: External ear normal.     Nose: Nose normal.     Mouth/Throat:     Mouth: Mucous membranes are moist.  Eyes:     Conjunctiva/sclera: Conjunctivae normal.  Cardiovascular:     Rate and Rhythm: Normal rate.  Pulmonary:     Effort: Pulmonary effort is normal. No respiratory distress.  Musculoskeletal:        General: Normal range of motion.  Skin:    General: Skin is warm and dry.     Findings: Abscess present.          Comments: 3 cm x 3 cm abscess to the left upper mons pubis area that is firm  and tender.  Neurological:     General: No focal deficit present.     Mental Status: She is alert and  oriented to person, place, and time.  Psychiatric:        Behavior: Behavior normal. Behavior is cooperative.      UC Treatments / Results  Labs (all labs ordered are listed, but only abnormal results are displayed) Labs Reviewed  CERVICOVAGINAL ANCILLARY ONLY    EKG   Radiology No results found.  Procedures Incision and Drainage  Date/Time: 01/17/2024 10:46 AM  Performed by: Kaliel Bolds, [place] N, FNP Authorized by: Alyshia Kernan, [place] N, FNP   Consent:    Consent obtained:  Verbal   Consent given by:  Patient   Risks, benefits, and alternatives were discussed: yes     Risks discussed:  Bleeding, pain and incomplete drainage   Alternatives discussed:  Delayed treatment and no treatment Universal protocol:    Procedure explained and questions answered to patient or proxy's satisfaction: yes     Patient identity confirmed:  Verbally with patient Location:    Type:  Abscess   Size:  3cm x 3cm   Location:  Anogenital   Anogenital location:  Vulva Pre-procedure details:    Skin preparation:  Povidone-iodine Sedation:    Sedation type:  None Anesthesia:    Anesthesia method:  Topical application   Topical anesthetic:  LET Procedure type:    Complexity:  Simple Procedure details:    Incision types:  Stab incision   Drainage:  Purulent and bloody   Drainage amount:  Moderate   Wound treatment:  Wound left open   Packing materials:  None Post-procedure details:    Procedure completion:  Tolerated well, no immediate complications  (including critical care time)  Medications Ordered in UC Medications  lidocaine-EPINEPHrine-tetracaine (LET) topical gel (3 mLs Topical Given 01/17/24 1005)  ibuprofen (ADVIL) tablet 800 mg (800 mg Oral Given 01/17/24 1021)    Initial Impression / Assessment and Plan / UC Course  I have reviewed the triage vital signs and the nursing notes.  Pertinent labs & imaging results that were available during my care of the patient were reviewed  by me and considered in my medical decision making (see chart for details).  Vitals and triage reviewed, patient is hemodynamically stable.  Chaperone present for external GU exam.  3 cm x 3 cm round, firm and tender abscess to the left sided mons pubis area.  RBA of I&D discussed, there are some areas of fluctuance and mostly induration, discussed we may not have good results with I&D.  Patient opted to move forward with I&D due to pain and discomfort. I&D completed. Vaginal swab performed d/t vaginal itching with thick white discharge on PE. Will trt for yeast w/ Diflucan. Bactrim for abscess. POC, f/u care and return precautions given, no questions at this time.      Final Clinical Impressions(s) / UC Diagnoses   Final diagnoses:  Abscess of left genital labia  Acute vaginitis     Discharge Instructions      We drained an abscess today, please keep the area clean and dry. Warm compresses 3x daily will help, take 800 mg ibuprofen every 8 hours for pain and swelling. Take the diflucan for vaginal itching and staff will contact if treatment modifications are needed based on your vaginal swab.   Return to clinic for new or urgent symptoms.      ED Prescriptions     Medication Sig Dispense Auth. Provider  sulfamethoxazole-trimethoprim (BACTRIM DS) 800-160 MG tablet Take 1 tablet by mouth 2 (two) times daily for 7 days. 14 tablet Rinaldo Ratel, [place] N, [place]   fluconazole (DIFLUCAN) 150 MG tablet Take one table today and another in 72 hours if vaginal itching persists. 2 tablet Kady Toothaker, [place] N, FNP      PDMP not reviewed this encounter.   Roth Ress, [place] N, [place] 01/17/24 1047

## 2024-01-17 NOTE — Discharge Instructions (Signed)
We drained an abscess today, please keep the area clean and dry. Warm compresses 3x daily will help, take 800 mg ibuprofen every 8 hours for pain and swelling. Take the diflucan for vaginal itching and staff will contact if treatment modifications are needed based on your vaginal swab.   Return to clinic for new or urgent symptoms.

## 2024-01-20 ENCOUNTER — Telehealth (HOSPITAL_COMMUNITY): Payer: Self-pay

## 2024-01-20 LAB — CERVICOVAGINAL ANCILLARY ONLY
Bacterial Vaginitis (gardnerella): POSITIVE — AB
Candida Glabrata: POSITIVE — AB
Candida Vaginitis: POSITIVE — AB
Chlamydia: NEGATIVE
Comment: NEGATIVE
Comment: NEGATIVE
Comment: NEGATIVE
Comment: NEGATIVE
Comment: NEGATIVE
Comment: NORMAL
Neisseria Gonorrhea: NEGATIVE
Trichomonas: POSITIVE — AB

## 2024-01-20 MED ORDER — METRONIDAZOLE 500 MG PO TABS: 500.0000 mg | ORAL_TABLET | Freq: Two times a day (BID) | ORAL | 0 refills | Status: AC

## 2024-01-20 NOTE — Telephone Encounter (Signed)
 Per protocol, pt requires tx with metronidazole. Reviewed with patient, verified pharmacy, prescription sent.

## 2024-02-18 ENCOUNTER — Ambulatory Visit (HOSPITAL_COMMUNITY): Payer: Self-pay

## 2024-02-19 ENCOUNTER — Other Ambulatory Visit: Payer: Self-pay

## 2024-02-19 ENCOUNTER — Ambulatory Visit (HOSPITAL_COMMUNITY)
Admission: RE | Admit: 2024-02-19 | Discharge: 2024-02-19 | Disposition: A | Discharge status: Home / Self Care | Payer: Self-pay | Source: Ambulatory Visit | Attending: Emergency Medicine | Admitting: Emergency Medicine

## 2024-02-19 ENCOUNTER — Encounter (HOSPITAL_COMMUNITY): Payer: Self-pay

## 2024-02-19 VITALS — BP 149/90 | HR 102 | Temp 98.4°F | Resp 18

## 2024-02-19 DIAGNOSIS — N898 Other specified noninflammatory disorders of vagina: Secondary | ICD-10-CM | POA: Insufficient documentation

## 2024-02-19 DIAGNOSIS — Z202 Contact with and (suspected) exposure to infections with a predominantly sexual mode of transmission: Secondary | ICD-10-CM | POA: Insufficient documentation

## 2024-02-19 LAB — HIV ANTIBODY (ROUTINE TESTING W REFLEX): HIV Screen 4th Generation wRfx: NONREACTIVE

## 2024-02-19 LAB — RPR: RPR Ser Ql: NONREACTIVE

## 2024-02-19 MED ORDER — METRONIDAZOLE 500 MG PO TABS: 500.0000 mg | ORAL_TABLET | Freq: Two times a day (BID) | ORAL | 0 refills | Status: DC

## 2024-02-19 MED ORDER — FLUCONAZOLE 150 MG PO TABS: ORAL_TABLET | ORAL | 0 refills | Status: DC

## 2024-02-19 NOTE — ED Triage Notes (Signed)
 PT reports she has a yeast infection. Pt also wants a full STD panel done to make sure she does not have a STD.

## 2024-02-19 NOTE — Discharge Instructions (Signed)
 Start taking Flagyl twice daily for 7 days for possible trichomonas infection. Take 1 tablet of Diflucan today and 1 tablet in 3 days if symptoms persist for yeast infection coverage. Your results will come back over the next few days and someone will call if results are positive and require treatment.  Return here as needed.

## 2024-02-19 NOTE — ED Provider Notes (Signed)
 [facility]    CSN: [id number] Arrival date & time: 02/19/24  1108      History   Chief Complaint Chief Complaint  Patient presents with   Vaginal Itching    Yeast infection - Entered by patient   SEXUALLY TRANSMITTED DISEASE   Vaginal Discharge    HPI Emma Jacobs is a 28 y.o. female.   Patient presents with white clumpy discharge and vaginal itching x 2 weeks.  Denies abnormal vaginal bleeding, dysuria, urinary frequency, and hematuria.  Patient states that a recent partner of hers informed her that he tested positive for trichomonas.  Patient is requesting STD testing.   Vaginal Itching  Vaginal Discharge Associated symptoms: vaginal itching     Past Medical History:  Diagnosis Date   Asthma    currently uses inhaler   Gestational hypertension     Patient Active Problem List   Diagnosis Date Noted   Cesarean delivery delivered 01/25/2014   Labor and delivery indication for care or intervention 01/24/2014   Pregnancy 12/25/2013   Gestational hypertension 12/15/2013   Pyelectasis of fetus on prenatal ultrasound 09/18/2013   Teen pregnancy 08/11/2013   Family history of diabetes mellitus (DM) 08/11/2013   Obesity 08/11/2013    Past Surgical History:  Procedure Laterality Date   CESAREAN SECTION N/A 01/25/2014   Procedure: CESAREAN SECTION;  Surgeon: Antionette Char, MD;  Location: [facility];  Service: Obstetrics;  Laterality: N/A;   NO PAST SURGERIES      OB History     Gravida  1   Para  1   Term  1   Preterm      AB      Living  1      SAB      IAB      Ectopic      Multiple      Live Births  1            Home Medications    Prior to Admission medications   Medication Sig Start Date End Date Taking? Authorizing Provider  fluconazole (DIFLUCAN) 150 MG tablet Take one tablet today and one tablet in 3 days if symptoms persist. 02/19/24  Yes Wynonia Lawman A, NP  metroNIDAZOLE (FLAGYL) 500 MG tablet Take 1  tablet (500 mg total) by mouth 2 (two) times daily. 02/19/24  Yes Susann Givens, Redith Drach A, NP  amLODipine (NORVASC) 5 MG tablet Take 1 tablet (5 mg total) by mouth daily. Patient not taking: Reported on 09/05/2023 10/12/22   Carlisle Beers, FNP  Blood Pressure Monitoring (BLOOD PRESSURE CUFF) MISC Take blood pressure once daily for 2 weeks, then 2-3 times a week.  Write numbers down in a notebook. 10/12/22   Carlisle Beers, FNP    Family History Family History  Problem Relation Age of Onset   Hypertension Maternal Grandmother    Diabetes Maternal Grandmother     Social History Social History   Tobacco Use   Smoking status: Some Days    Types: Cigars   Smokeless tobacco: Never  Vaping Use   Vaping status: Former  Substance Use Topics   Alcohol use: Yes    Comment: wine occasionally   Drug use: Not Currently    Types: Marijuana     Allergies   Other   Review of Systems Review of Systems  Genitourinary:  Positive for vaginal discharge.   Per HPI  Physical Exam Triage Vital Signs ED Triage Vitals  Encounter Vitals Group  BP 02/19/24 1122 (!) 149/90     Systolic BP Percentile --      Diastolic BP Percentile --      Pulse Rate 02/19/24 1122 (!) 102     Resp 02/19/24 1122 18     Temp 02/19/24 1122 98.4 F (36.9 C)     Temp src --      SpO2 02/19/24 1122 95 %     Weight --      Height --      Head Circumference --      Peak Flow --      Pain Score 02/19/24 1120 0     Pain Loc --      Pain Education --      Exclude from Growth Chart --    No data found.  Updated Vital Signs BP (!) 149/90   Pulse (!) 102   Temp 98.4 F (36.9 C)   Resp 18   LMP 02/16/2024   SpO2 95%   Visual Acuity Right Eye Distance:   Left Eye Distance:   Bilateral Distance:    Right Eye Near:   Left Eye Near:    Bilateral Near:     Physical Exam Vitals and nursing note reviewed.  Constitutional:      General: She is awake. She is not in acute distress.     Appearance: Normal appearance. She is well-developed and well-groomed. She is not ill-appearing.  Genitourinary:    Comments: Exam deferred Neurological:     Mental Status: She is alert.  Psychiatric:        Behavior: Behavior is cooperative.      UC Treatments / Results  Labs (all labs ordered are listed, but only abnormal results are displayed) Labs Reviewed  HIV ANTIBODY (ROUTINE TESTING W REFLEX)  RPR  CERVICOVAGINAL ANCILLARY ONLY    EKG   Radiology No results found.  Procedures Procedures (including critical care time)  Medications Ordered in UC Medications - No data to display  Initial Impression / Assessment and Plan / UC Course  I have reviewed the triage vital signs and the nursing notes.  Pertinent labs & imaging results that were available during my care of the patient were reviewed by me and considered in my medical decision making (see chart for details).     GU exam deferred, patient perform self swab for STD/STI.  HIV and syphilis testing ordered.  Empirically treated for yeast infection with Diflucan and empirically treated for trichomonas exposure with Flagyl.  Discussed follow-up and return precautions. Final Clinical Impressions(s) / UC Diagnoses   Final diagnoses:  Vaginal discharge  Vaginal itching  Exposure to trichomonas     Discharge Instructions      Start taking Flagyl twice daily for 7 days for possible trichomonas infection. Take 1 tablet of Diflucan today and 1 tablet in 3 days if symptoms persist for yeast infection coverage. Your results will come back over the next few days and someone will call if results are positive and require treatment.  Return here as needed.    ED Prescriptions     Medication Sig Dispense Auth. Provider   fluconazole (DIFLUCAN) 150 MG tablet Take one tablet today and one tablet in 3 days if symptoms persist. 2 tablet Wynonia Lawman A, NP   metroNIDAZOLE (FLAGYL) 500 MG tablet Take 1 tablet (500  mg total) by mouth 2 (two) times daily. 14 tablet Wynonia Lawman A, NP      PDMP not reviewed this encounter.  Wynonia Lawman A, NP 02/19/24 1224

## 2024-02-20 LAB — CERVICOVAGINAL ANCILLARY ONLY
Bacterial Vaginitis (gardnerella): POSITIVE — AB
Candida Glabrata: POSITIVE — AB
Candida Vaginitis: POSITIVE — AB
Chlamydia: NEGATIVE
Comment: NEGATIVE
Comment: NEGATIVE
Comment: NEGATIVE
Comment: NEGATIVE
Comment: NEGATIVE
Comment: NORMAL
Neisseria Gonorrhea: NEGATIVE
Trichomonas: NEGATIVE

## 2024-03-22 ENCOUNTER — Telehealth: Admitting: Physician Assistant

## 2024-03-22 DIAGNOSIS — B3731 Acute candidiasis of vulva and vagina: Secondary | ICD-10-CM | POA: Diagnosis not present

## 2024-03-23 MED ORDER — FLUCONAZOLE 150 MG PO TABS: 150.0000 mg | ORAL_TABLET | ORAL | 0 refills | Status: DC | PRN

## 2024-03-23 NOTE — Progress Notes (Signed)

## 2024-04-15 ENCOUNTER — Telehealth: Admitting: Physician Assistant

## 2024-04-15 DIAGNOSIS — N760 Acute vaginitis: Secondary | ICD-10-CM

## 2024-04-15 NOTE — Progress Notes (Signed)
  Because of recent treatment for vaginitis via e-visit (within past month) and need for examination and swabbing, I feel your condition warrants further evaluation and I recommend that you be seen in a face-to-face visit.   NOTE: There will be NO CHARGE for this E-Visit   If you are having a true medical emergency, please call 911.     For an urgent face to face visit, [place] has multiple urgent care centers for your convenience.  Click the link below for the full list of locations and hours, walk-in wait times, appointment scheduling options and driving directions:  Urgent Care - [place], [place], [place], [place], [place], [place]  [place]     Your MyChart E-visit questionnaire answers were reviewed by a board certified advanced clinical practitioner to complete your personal care plan based on your specific symptoms.    Thank you for using e-Visits.

## 2024-04-17 ENCOUNTER — Ambulatory Visit (HOSPITAL_COMMUNITY)

## 2024-04-21 ENCOUNTER — Encounter (HOSPITAL_COMMUNITY): Payer: Self-pay

## 2024-04-21 ENCOUNTER — Ambulatory Visit (HOSPITAL_COMMUNITY)
Admission: EM | Admit: 2024-04-21 | Discharge: 2024-04-21 | Disposition: A | Discharge status: Home / Self Care | Attending: Emergency Medicine | Admitting: Emergency Medicine

## 2024-04-21 DIAGNOSIS — J302 Other seasonal allergic rhinitis: Secondary | ICD-10-CM | POA: Diagnosis not present

## 2024-04-21 DIAGNOSIS — N898 Other specified noninflammatory disorders of vagina: Secondary | ICD-10-CM | POA: Diagnosis not present

## 2024-04-21 DIAGNOSIS — Z3202 Encounter for pregnancy test, result negative: Secondary | ICD-10-CM

## 2024-04-21 DIAGNOSIS — J029 Acute pharyngitis, unspecified: Secondary | ICD-10-CM | POA: Diagnosis not present

## 2024-04-21 LAB — POCT URINE PREGNANCY: Preg Test, Ur: NEGATIVE

## 2024-04-21 LAB — POCT RAPID STREP A (OFFICE): Rapid Strep A Screen: NEGATIVE

## 2024-04-21 NOTE — ED Provider Notes (Signed)
 [facility]    CSN: [id number] Arrival date & time: 04/21/24  0810      History   Chief Complaint Chief Complaint  Patient presents with   Sore Throat   Vaginal Discharge    HPI Emma Jacobs is a 28 y.o. female.   28 year old female, Emma Jacobs, presents to urgent care for evaluation of sore throat x 1 day.  Patient reports postnasal drainage and nasal congestion.  No known illness exposure.  Patient also complaining of white, itchy vaginal discharge x 1 day.  Patient also would like to get a pregnancy test, no new partner, does have unprotected intercourse.   No meds taken prior to arrival  Past Medical history: Asthma, gestational hypertension  The history is provided by the patient. No language interpreter was used.    Past Medical History:  Diagnosis Date   Asthma    currently uses inhaler   Gestational hypertension     Patient Active Problem List   Diagnosis Date Noted   Viral pharyngitis 04/21/2024   Vaginal discharge 04/21/2024   Seasonal allergies 04/21/2024   Cesarean delivery delivered 01/25/2014   Labor and delivery indication for care or intervention 01/24/2014   Pregnancy 12/25/2013   Gestational hypertension 12/15/2013   Pyelectasis of fetus on prenatal ultrasound 09/18/2013   Teen pregnancy 08/11/2013   Family history of diabetes mellitus (DM) 08/11/2013   Obesity 08/11/2013    Past Surgical History:  Procedure Laterality Date   CESAREAN SECTION N/A 01/25/2014   Procedure: CESAREAN SECTION;  Surgeon: Abdul Hodgkin, MD;  Location: [facility];  Service: Obstetrics;  Laterality: N/A;   NO PAST SURGERIES      OB History     Gravida  1   Para  1   Term  1   Preterm      AB      Living  1      SAB      IAB      Ectopic      Multiple      Live Births  1            Home Medications    Prior to Admission medications   Medication Sig Start Date End Date Taking? Authorizing Provider  Blood Pressure  Monitoring (BLOOD PRESSURE CUFF) MISC Take blood pressure once daily for 2 weeks, then 2-3 times a week.  Write numbers down in a notebook. 10/12/22   Starlene Eaton, FNP    Family History Family History  Problem Relation Age of Onset   Hypertension Maternal Grandmother    Diabetes Maternal Grandmother     Social History Social History   Tobacco Use   Smoking status: Some Days    Types: Cigars   Smokeless tobacco: Never  Vaping Use   Vaping status: Former  Substance Use Topics   Alcohol use: Yes    Comment: wine occasionally   Drug use: Not Currently    Types: Marijuana     Allergies   Other   Review of Systems Review of Systems  Constitutional:  Negative for fever.  HENT:  Positive for congestion and sinus pressure.   Respiratory:  Negative for cough.   Gastrointestinal:  Negative for abdominal pain.  Genitourinary:  Positive for vaginal discharge. Negative for dysuria and frequency.  Musculoskeletal:  Negative for back pain.  All other systems reviewed and are negative.    Physical Exam Triage Vital Signs ED Triage Vitals  Encounter Vitals Group  BP 04/21/24 0833 132/83     Systolic BP Percentile --      Diastolic BP Percentile --      Pulse Rate 04/21/24 0833 (!) 109     Resp 04/21/24 0833 16     Temp 04/21/24 0833 98.4 F (36.9 C)     Temp Source 04/21/24 0833 Oral     SpO2 04/21/24 0833 99 %     Weight --      Height --      Head Circumference --      Peak Flow --      Pain Score 04/21/24 0835 7     Pain Loc --      Pain Education --      Exclude from Growth Chart --    No data found.  Updated Vital Signs BP 132/83 (BP Location: Left Arm)   Pulse (!) 109   Temp 98.4 F (36.9 C) (Oral)   Resp 16   LMP 04/07/2024 (Approximate)   SpO2 99%   Visual Acuity Right Eye Distance:   Left Eye Distance:   Bilateral Distance:    Right Eye Near:   Left Eye Near:    Bilateral Near:     Physical Exam Vitals and nursing note  reviewed.  Constitutional:      General: She is not in acute distress.    Appearance: She is well-developed and well-groomed.  HENT:     Head: Normocephalic.     Right Ear: Tympanic membrane is retracted.     Left Ear: Tympanic membrane is retracted.     Nose: Mucosal edema and congestion present.     Right Sinus: Maxillary sinus tenderness present. No frontal sinus tenderness.     Left Sinus: Maxillary sinus tenderness present. No frontal sinus tenderness.     Mouth/Throat:     Lips: Pink.     Mouth: Mucous membranes are moist.     Pharynx: Uvula midline. Posterior oropharyngeal erythema present.  Eyes:     General: Lids are normal.     Conjunctiva/sclera: Conjunctivae normal.     Pupils: Pupils are equal, round, and reactive to light.  Neck:     Trachea: No tracheal deviation.  Cardiovascular:     Rate and Rhythm: Normal rate and regular rhythm.     Pulses: Normal pulses.     Heart sounds: Normal heart sounds. No murmur heard. Pulmonary:     Effort: Pulmonary effort is normal.  Abdominal:     General: Bowel sounds are normal.     Palpations: Abdomen is soft.     Tenderness: There is no abdominal tenderness.  Genitourinary:    Comments: Deferred, pt self swabbed Musculoskeletal:        General: Normal range of motion.     Cervical back: Normal range of motion.  Lymphadenopathy:     Cervical: No cervical adenopathy.  Skin:    General: Skin is warm and dry.     Findings: No rash.  Neurological:     General: No focal deficit present.     Mental Status: She is alert and oriented to person, place, and time.     GCS: GCS eye subscore is 4. GCS verbal subscore is 5. GCS motor subscore is 6.  Psychiatric:        Attention and Perception: Attention normal.        Mood and Affect: Mood normal.        Speech: Speech normal.  Behavior: Behavior normal. Behavior is cooperative.      UC Treatments / Results  Labs (all labs ordered are listed, but only abnormal results  are displayed) Labs Reviewed  POCT URINE PREGNANCY - Normal  POCT RAPID STREP A (OFFICE) - Normal  CERVICOVAGINAL ANCILLARY ONLY    EKG   Radiology No results found.  Procedures Procedures (including critical care time)  Medications Ordered in UC Medications - No data to display  Initial Impression / Assessment and Plan / UC Course  I have reviewed the triage vital signs and the nursing notes.  Pertinent labs & imaging results that were available during my care of the patient were reviewed by me and considered in my medical decision making (see chart for details).  Clinical Course as of 04/21/24 1107  Tue Apr 21, 2024  1308 Patient self swabbed Aptima to check for STIs, BV, yeast infection.  Patient also requesting urine pregnancy, lastly rapid strep performed for sore throat symptoms. [JD]  0903 Preg test negative [JD]  0915 Strep negative [JD]    Clinical Course User Index [JD] Reo Portela, Eveleen Hinds, NP   Discussed exam findings and plan of care with patient, strict go to ER precautions given.   Patient verbalized understanding to this provider.  Ddx: Viral pharyngitis, allergies, vaginal discharge Final Clinical Impressions(s) / UC Diagnoses   Final diagnoses:  Viral pharyngitis  Vaginal discharge  Seasonal allergies     Discharge Instructions      Your pregnancy test is negative Your strep test is negative Most likely you have a viral illness: no antibiotic is indicated at this time, May treat with OTC meds of choice(zyrtec,allegra,etc, flonase as label directed). Make sure to drink plenty of fluids to stay hydrated(gatorade, water, popsicles,jello,etc), avoid caffeine products. Follow up with PCP. Return as needed.  Check my chart for results. Avoid sexual activity until results,treatment known and completed. Safe sex with all future sexual activity. We have sent testing for sexually transmitted infections. We will notify you of any positive results once they are  received. If required, we will prescribe any medications you might need.        ED Prescriptions   None    PDMP not reviewed this encounter.   Peter Brands, NP 04/21/24 [phone]

## 2024-04-21 NOTE — Discharge Instructions (Signed)
 Your pregnancy test is negative Your strep test is negative Most likely you have a viral illness: no antibiotic is indicated at this time, May treat with OTC meds of choice(zyrtec,allegra,etc, flonase as label directed). Make sure to drink plenty of fluids to stay hydrated(gatorade, water, popsicles,jello,etc), avoid caffeine products. Follow up with PCP. Return as needed.  Check my chart for results. Avoid sexual activity until results,treatment known and completed. Safe sex with all future sexual activity. We have sent testing for sexually transmitted infections. We will notify you of any positive results once they are received. If required, we will prescribe any medications you might need.

## 2024-04-21 NOTE — ED Triage Notes (Addendum)
 Patient here today with c/o vaginal discharge and irritation X 1 week and roof of mouth and some ST pain X 1 day. Patient states that she can also feel some PND. Patient also requested a pregnancy test.

## 2024-04-22 LAB — CERVICOVAGINAL ANCILLARY ONLY
Bacterial Vaginitis (gardnerella): POSITIVE — AB
Candida Glabrata: POSITIVE — AB
Candida Vaginitis: POSITIVE — AB
Chlamydia: NEGATIVE
Comment: NEGATIVE
Comment: NEGATIVE
Comment: NEGATIVE
Comment: NEGATIVE
Comment: NEGATIVE
Comment: NORMAL
Neisseria Gonorrhea: NEGATIVE
Trichomonas: NEGATIVE

## 2024-04-23 ENCOUNTER — Ambulatory Visit (HOSPITAL_COMMUNITY): Payer: Self-pay

## 2024-04-23 MED ORDER — METRONIDAZOLE 500 MG PO TABS: 500.0000 mg | ORAL_TABLET | Freq: Two times a day (BID) | ORAL | 0 refills | Status: DC

## 2024-04-23 MED ORDER — FLUCONAZOLE 150 MG PO TABS: 150.0000 mg | ORAL_TABLET | Freq: Once | ORAL | 0 refills | Status: AC

## 2024-05-01 ENCOUNTER — Ambulatory Visit (HOSPITAL_COMMUNITY)
Admission: RE | Admit: 2024-05-01 | Discharge: 2024-05-01 | Disposition: A | Discharge status: Home / Self Care | Source: Ambulatory Visit | Attending: Emergency Medicine | Admitting: Emergency Medicine

## 2024-05-01 ENCOUNTER — Encounter (HOSPITAL_COMMUNITY): Payer: Self-pay

## 2024-05-01 VITALS — BP 157/93 | HR 92 | Temp 99.4°F | Resp 18

## 2024-05-01 DIAGNOSIS — L732 Hidradenitis suppurativa: Secondary | ICD-10-CM

## 2024-05-01 MED ORDER — CETIRIZINE HCL 10 MG PO TABS: 10.0000 mg | ORAL_TABLET | Freq: Every day | ORAL | 2 refills | Status: AC

## 2024-05-01 MED ORDER — CLINDAMYCIN PHOS (TWICE-DAILY) 1 % EX GEL
Freq: Two times a day (BID) | CUTANEOUS | 2 refills | Status: DC
Start: 2024-05-01 — End: 2024-07-06

## 2024-05-01 MED ORDER — DOXYCYCLINE HYCLATE 100 MG PO CAPS: 100.0000 mg | ORAL_CAPSULE | Freq: Two times a day (BID) | ORAL | 2 refills | Status: DC

## 2024-05-01 NOTE — Discharge Instructions (Signed)
 I have enclosed some information about hidradenitis that I hope you find helpful.  Please reach out to your primary care provider to discuss referral to dermatology for further management of this condition.  Please be advised that smoking cigarettes can make this skin condition worse.  Please read below to learn more about the medications, dosages and frequencies that I recommend to help alleviate your symptoms and to get you feeling better soon:   Adoxa, Vibramycin  (doxycycline ): Please take 1 capsule twice daily.  This antibiotic can make you more sensitive to sunlight and may cause you to burn more easily.  Please avoid direct exposure while taking.  Please also avoid taking this medication with foods that contain calcium  such as dairy products (milk, yogurt, cheese, ice cream).  Calcium  binds with doxycycline  and prevents your body from absorbing it.  Please separate dairy products contain calcium  and taking doxycycline  by 2 hours.  Clindagel (clindamycin): Please apply antibiotic gel gel to affected area twice daily.   Zyrtec (cetirizine): This is an excellent second-generation antihistamine that helps to reduce respiratory inflammatory response to environmental allergens.  In some patients, this medication can cause daytime sleepiness so I recommend that you take 1 tablet daily at bedtime.    Please also consider using head and shoulders original formula shampoo was a body wash 2-3 times weekly.  This shampoo is an excellent exfoliate and also helps prevent recurrence of hidradenitis lesions.   If symptoms have not meaningfully improved in the next 7 to 10 days, please return for repeat evaluation or follow-up with your regular provider.  If symptoms have worsened in the next 7 to 10 days, please go to the emergency room for further evaluation.    Thank you for visiting urgent care today.  We appreciate the opportunity to participate in your care.

## 2024-05-01 NOTE — ED Provider Notes (Signed)
 [facility]    CSN: [id number] Arrival date & time: 05/01/24  1337    HISTORY   Chief Complaint  Patient presents with   Groin Pain    Had previous incision for ingrown hair removed, pain/swelling at incision site - Entered by patient   Abscess   HPI Emma Jacobs is a pleasant, 28 y.o. female who presents to urgent care today. Patient complains of recurrence of left-sided groin abscess that was incised and drained in February of this year.  Patient states initially the lesion seemed to be completely resolved but is gradually started to return.  States is not as bad as it was but is getting there.  Patient denies fever, body aches, chills, red streaking.  Patient endorses swelling and tenderness to palpation of the lesion.  Patient states he has not tried anything to treat the lesion as of yet.  The history is provided by the patient.  Groin Pain  Abscess  Past Medical History:  Diagnosis Date   Asthma    currently uses inhaler   Gestational hypertension    Patient Active Problem List   Diagnosis Date Noted   Viral pharyngitis 04/21/2024   Vaginal discharge 04/21/2024   Seasonal allergies 04/21/2024   Cesarean delivery delivered 01/25/2014   Labor and delivery indication for care or intervention 01/24/2014   Pregnancy 12/25/2013   Gestational hypertension 12/15/2013   Pyelectasis of fetus on prenatal ultrasound 09/18/2013   Teen pregnancy 08/11/2013   Family history of diabetes mellitus (DM) 08/11/2013   Obesity 08/11/2013   Past Surgical History:  Procedure Laterality Date   CESAREAN SECTION N/A 01/25/2014   Procedure: CESAREAN SECTION;  Surgeon: Abdul Hodgkin, MD;  Location: [facility];  Service: Obstetrics;  Laterality: N/A;   NO PAST SURGERIES     OB History     Gravida  1   Para  1   Term  1   Preterm      AB      Living  1      SAB      IAB      Ectopic      Multiple      Live Births  1          Home Medications     Prior to Admission medications   Medication Sig Start Date End Date Taking? Authorizing Provider  Blood Pressure Monitoring (BLOOD PRESSURE CUFF) MISC Take blood pressure once daily for 2 weeks, then 2-3 times a week.  Write numbers down in a notebook. 10/12/22   Starlene Eaton, FNP  metroNIDAZOLE  (FLAGYL ) 500 MG tablet Take 1 tablet (500 mg total) by mouth 2 (two) times daily. 04/23/24   Ann Keto, MD    Family History Family History  Problem Relation Age of Onset   Hypertension Maternal Grandmother    Diabetes Maternal Grandmother    Social History Social History   Tobacco Use   Smoking status: Some Days    Types: Cigars   Smokeless tobacco: Never  Vaping Use   Vaping status: Former  Substance Use Topics   Alcohol use: Yes    Comment: wine occasionally   Drug use: Not Currently    Types: Marijuana   Allergies   Other  Review of Systems Review of Systems Pertinent findings revealed after performing a 14 point review of systems has been noted in the history of present illness.  Physical Exam Vital Signs BP (!) 157/93 (BP Location: Left Arm)  Pulse 92   Temp 99.4 F (37.4 C) (Oral)   Resp 18   LMP 04/26/2024 (Approximate)   SpO2 98%   No data found.  Physical Exam Vitals and nursing note reviewed.  Constitutional:      General: She is not in acute distress.    Appearance: Normal appearance. She is not ill-appearing.  HENT:     Head: Normocephalic and atraumatic.  Eyes:     Pupils: Pupils are equal, round, and reactive to light.  Cardiovascular:     Rate and Rhythm: Normal rate and regular rhythm.  Pulmonary:     Effort: Pulmonary effort is normal.  Abdominal:    Musculoskeletal:        General: Normal range of motion.     Cervical back: Normal range of motion and neck supple.  Skin:    General: Skin is warm and dry.  Neurological:     General: No focal deficit present.     Mental Status: She is alert and oriented to person,  place, and time. Mental status is at baseline.  Psychiatric:        Mood and Affect: Mood normal.        Behavior: Behavior normal.        Thought Content: Thought content normal.        Judgment: Judgment normal.     Visual Acuity Right Eye Distance:   Left Eye Distance:   Bilateral Distance:    Right Eye Near:   Left Eye Near:    Bilateral Near:     UC Couse / Diagnostics / Procedures:     Radiology No results found.  Procedures Procedures (including critical care time) EKG  Pending results:  Labs Reviewed - No data to display  Medications Ordered in UC: Medications - No data to display  UC Diagnoses / Final Clinical Impressions(s)   I have reviewed the triage vital signs and the nursing notes.  Pertinent labs & imaging results that were available during my care of the patient were reviewed by me and considered in my medical decision making (see chart for details).    Final diagnoses:  Hidradenitis suppurativa   Given recurrence of lesion in the exactly the same spot as before, suspect patient is suffering from hidradenitis suppurativa.  Patient provided with doxycycline  p.o. and topical clindamycin, Zyrtec for management of symptoms.  Patient advised to reach out to PCP to discuss referral to dermatology for further management and treatment of this condition.  Patient also advised to bathe with head and shoulders shampoo 2-3 times a week to reduce fungal load.  Conservative care recommended.  Return precautions advised.  Please see discharge instructions below for details of plan of care as provided to patient. ED Prescriptions     Medication Sig Dispense Auth. Provider   cetirizine (ZYRTEC ALLERGY) 10 MG tablet Take 1 tablet (10 mg total) by mouth at bedtime. 30 tablet Eloise Hake Scales, PA-C   doxycycline  (VIBRAMYCIN ) 100 MG capsule Take 1 capsule (100 mg total) by mouth 2 (two) times daily. 60 capsule Eloise Hake Scales, PA-C   clindamycin (CLINDAGEL)  1 % gel Apply topically 2 (two) times daily. 60 g Eloise Hake Scales, PA-C      PDMP not reviewed this encounter.  Pending results:  Labs Reviewed - No data to display    Discharge Instructions      I have enclosed some information about hidradenitis that I hope you find helpful.  Please reach out to your  primary care provider to discuss referral to dermatology for further management of this condition.  Please be advised that smoking cigarettes can make this skin condition worse.  Please read below to learn more about the medications, dosages and frequencies that I recommend to help alleviate your symptoms and to get you feeling better soon:   Adoxa, Vibramycin  (doxycycline ): Please take 1 capsule twice daily.  This antibiotic can make you more sensitive to sunlight and may cause you to burn more easily.  Please avoid direct exposure while taking.  Please also avoid taking this medication with foods that contain calcium  such as dairy products (milk, yogurt, cheese, ice cream).  Calcium  binds with doxycycline  and prevents your body from absorbing it.  Please separate dairy products contain calcium  and taking doxycycline  by 2 hours.  Clindagel (clindamycin): Please apply antibiotic gel gel to affected area twice daily.   Zyrtec (cetirizine): This is an excellent second-generation antihistamine that helps to reduce respiratory inflammatory response to environmental allergens.  In some patients, this medication can cause daytime sleepiness so I recommend that you take 1 tablet daily at bedtime.    Please also consider using head and shoulders original formula shampoo was a body wash 2-3 times weekly.  This shampoo is an excellent exfoliate and also helps prevent recurrence of hidradenitis lesions.   If symptoms have not meaningfully improved in the next 7 to 10 days, please return for repeat evaluation or follow-up with your regular provider.  If symptoms have worsened in the next 7 to 10  days, please go to the emergency room for further evaluation.    Thank you for visiting urgent care today.  We appreciate the opportunity to participate in your care.     Disposition Upon Discharge:  Condition: stable for discharge home  Patient presented with an acute illness with associated systemic symptoms and significant discomfort requiring urgent management. In my opinion, this is a condition that a prudent lay person (someone who possesses an average knowledge of health and medicine) may potentially expect to result in complications if not addressed urgently such as respiratory distress, impairment of bodily function or dysfunction of bodily organs.   Routine symptom specific, illness specific and/or disease specific instructions were discussed with the patient and/or caregiver at length.   As such, the patient has been evaluated and assessed, work-up was performed and treatment was provided in alignment with urgent care protocols and evidence based medicine.  Patient/parent/caregiver has been advised that the patient may require follow up for further testing and treatment if the symptoms continue in spite of treatment, as clinically indicated and appropriate.  Patient/parent/caregiver has been advised to return to the [facility] or PCP if no better; to PCP or the Emergency Department if new signs and symptoms develop, or if the current signs or symptoms continue to change or worsen for further workup, evaluation and treatment as clinically indicated and appropriate  The patient will follow up with their current PCP if and as advised. If the patient does not currently have a PCP we will assist them in obtaining one.   The patient may need specialty follow up if the symptoms continue, in spite of conservative treatment and management, for further workup, evaluation, consultation and treatment as clinically indicated and appropriate.  Patient/parent/caregiver verbalized understanding and  agreement of plan as discussed.  All questions were addressed during visit.  Please see discharge instructions below for further details of plan.  This office note has been dictated using Dragon speech recognition  software.  Unfortunately, this method of dictation can sometimes lead to typographical or grammatical errors.  I apologize for your inconvenience in advance if this occurs.  Please do not hesitate to reach out to me if clarification is needed.      Eloise Hake Scales, PA-C 05/01/24 [phone]

## 2024-05-01 NOTE — ED Triage Notes (Signed)
 Pt states she has a abscess drained back in 01/2024 and now that area is swollen and painful again.

## 2024-05-23 ENCOUNTER — Telehealth: Admitting: Physician Assistant

## 2024-05-23 DIAGNOSIS — N760 Acute vaginitis: Secondary | ICD-10-CM

## 2024-05-23 MED ORDER — FLUCONAZOLE 150 MG PO TABS: 150.0000 mg | ORAL_TABLET | ORAL | 0 refills | Status: DC

## 2024-05-23 NOTE — Progress Notes (Signed)
 I have spent 5 minutes in review of e-visit questionnaire, review and updating patient chart, medical decision making and response to patient.   Laure Kidney, PA-C

## 2024-05-23 NOTE — Progress Notes (Signed)

## 2024-07-05 ENCOUNTER — Ambulatory Visit (HOSPITAL_COMMUNITY)

## 2024-07-06 ENCOUNTER — Encounter (HOSPITAL_COMMUNITY): Payer: Self-pay

## 2024-07-06 ENCOUNTER — Ambulatory Visit (HOSPITAL_COMMUNITY)
Admission: RE | Admit: 2024-07-06 | Discharge: 2024-07-06 | Disposition: A | Discharge status: Home / Self Care | Source: Ambulatory Visit | Attending: Physician Assistant | Admitting: Physician Assistant

## 2024-07-06 VITALS — BP 130/87 | HR 94 | Temp 98.6°F | Resp 16

## 2024-07-06 DIAGNOSIS — N898 Other specified noninflammatory disorders of vagina: Secondary | ICD-10-CM | POA: Insufficient documentation

## 2024-07-06 DIAGNOSIS — E1165 Type 2 diabetes mellitus with hyperglycemia: Secondary | ICD-10-CM | POA: Diagnosis not present

## 2024-07-06 DIAGNOSIS — B3731 Acute candidiasis of vulva and vagina: Secondary | ICD-10-CM | POA: Diagnosis not present

## 2024-07-06 LAB — POCT URINALYSIS DIP (MANUAL ENTRY)
Bilirubin, UA: NEGATIVE
Glucose, UA: >=1000 mg/dL — AB
Leukocytes, UA: NEGATIVE
Nitrite, UA: NEGATIVE
Protein Ur, POC: NEGATIVE mg/dL
Spec Grav, UA: 1.01 (ref 1.010–1.025)
Urobilinogen, UA: 0.2 U/dL
pH, UA: 5.5 (ref 5.0–8.0)

## 2024-07-06 LAB — COMPREHENSIVE METABOLIC PANEL WITH GFR
ALT: 11 U/L (ref 0–44)
AST: 18 U/L (ref 15–41)
Albumin: 3.7 g/dL (ref 3.5–5.0)
Alkaline Phosphatase: 143 U/L — ABNORMAL HIGH (ref 38–126)
Anion gap: 11 (ref 5–15)
BUN: <5 mg/dL — ABNORMAL LOW (ref 6–20)
CO2: 24 mmol/L (ref 22–32)
Calcium: 10 mg/dL (ref 8.9–10.3)
Chloride: 100 mmol/L (ref 98–111)
Creatinine, Ser: 0.65 mg/dL (ref 0.44–1.00)
GFR, Estimated: >60 mL/min (ref >60)
Glucose, Bld: 395 mg/dL — ABNORMAL HIGH (ref 70–99)
Potassium: 3.9 mmol/L (ref 3.5–5.1)
Sodium: 135 mmol/L (ref 135–145)
Total Bilirubin: 0.3 mg/dL (ref 0.0–1.2)
Total Protein: 7.8 g/dL (ref 6.5–8.1)

## 2024-07-06 LAB — HIV ANTIBODY (ROUTINE TESTING W REFLEX): HIV Screen 4th Generation wRfx: NONREACTIVE

## 2024-07-06 LAB — POCT FASTING CBG KUC MANUAL ENTRY: POCT Glucose (KUC): 386 mg/dL — AB (ref 70–99)

## 2024-07-06 MED ORDER — BLOOD GLUCOSE MONITORING SUPPL DEVI
1.0000 | Freq: Three times a day (TID) | 0 refills | Status: DC
Start: 2024-07-06 — End: 2024-07-10

## 2024-07-06 MED ORDER — LANCET DEVICE MISC: 1.0000 | Freq: Three times a day (TID) | 0 refills | Status: DC

## 2024-07-06 MED ORDER — LANCETS MISC. MISC: 1.0000 | Freq: Three times a day (TID) | 0 refills | Status: DC

## 2024-07-06 MED ORDER — BLOOD GLUCOSE TEST VI STRP
1.0000 | ORAL_STRIP | Freq: Three times a day (TID) | 0 refills | Status: AC
  Filled 2024-07-14: qty 100, 34d supply, fill #0

## 2024-07-06 MED ORDER — FLUCONAZOLE 150 MG PO TABS: 150.0000 mg | ORAL_TABLET | ORAL | 0 refills | Status: DC | PRN

## 2024-07-06 NOTE — ED Provider Notes (Signed)
 [facility]    CSN: [id number] Arrival date & time: 07/06/24  1503      History   Chief Complaint Chief Complaint  Patient presents with   Vaginal Itching    Entered by patient    HPI Emma Jacobs is a 28 y.o. female.   Patient presents today with a several week history of recurrent vaginal irritation and discharge.  She describes this as thick and white with associated pruritus.  She denies any urinary symptoms including frequency, urgency, abdominal pain.  She has had recurrent symptoms in the past and they typically respond to Diflucan  or other treatment only to recur again a few weeks later.  She does have a strong family history of diabetes and 1 year ago at our clinic on 07/09/2023 she was noted to have glucosuria and ultimately had an A1c of 11.7%.  She was then lost to follow-up and so never started medication and she was unaware of the diagnosis of diabetes.  She denies any recent antibiotics or changes to personal hygiene products.  She has no specific concern for STIs but is open to testing.  Denies any associated fever, nausea, vomiting, polyuria, polydipsia, polyphasia, visual disturbance, abdominal pain.  She has not tried any over-the-counter medication for symptom management.  She does have a primary care provider but has not seen them recently.    Past Medical History:  Diagnosis Date   Asthma    currently uses inhaler   Gestational hypertension     Patient Active Problem List   Diagnosis Date Noted   Viral pharyngitis 04/21/2024   Vaginal discharge 04/21/2024   Seasonal allergies 04/21/2024   Cesarean delivery delivered 01/25/2014   Labor and delivery indication for care or intervention 01/24/2014   Pregnancy 12/25/2013   Gestational hypertension 12/15/2013   Pyelectasis of fetus on prenatal ultrasound 09/18/2013   Teen pregnancy 08/11/2013   Family history of diabetes mellitus (DM) 08/11/2013   Obesity 08/11/2013    Past Surgical History:   Procedure Laterality Date   CESAREAN SECTION N/A 01/25/2014   Procedure: CESAREAN SECTION;  Surgeon: Olam Mill, MD;  Location: [facility];  Service: Obstetrics;  Laterality: N/A;   NO PAST SURGERIES      OB History     Gravida  1   Para  1   Term  1   Preterm      AB      Living  1      SAB      IAB      Ectopic      Multiple      Live Births  1            Home Medications    Prior to Admission medications   Medication Sig Start Date End Date Taking? Authorizing Provider  Blood Glucose Monitoring Suppl DEVI 1 each by Does not apply route in the morning, at noon, and at bedtime. May substitute to any manufacturer covered by patient's insurance. 07/06/24  Yes Kenedie Dirocco K, PA-C  fluconazole  (DIFLUCAN ) 150 MG tablet Take 1 tablet (150 mg total) by mouth every 3 (three) days as needed for up to 2 doses. 07/06/24  Yes Cadel Stairs K, PA-C  Glucose Blood (BLOOD GLUCOSE TEST STRIPS) STRP 1 each by In Vitro route in the morning, at noon, and at bedtime. May substitute to any manufacturer covered by patient's insurance. 07/06/24 08/05/24 Yes Kenika Sahm, Rocky POUR, PA-C  Lancet Device MISC 1 each by Does not apply  route in the morning, at noon, and at bedtime. May substitute to any manufacturer covered by patient's insurance. 07/06/24 08/05/24 Yes Efrat Zuidema, Rocky POUR, PA-C  Lancets Misc. MISC 1 each by Does not apply route in the morning, at noon, and at bedtime. May substitute to any manufacturer covered by patient's insurance. 07/06/24 08/05/24 Yes Yovanny Coats, Rocky POUR, PA-C  Blood Pressure Monitoring (BLOOD PRESSURE CUFF) MISC Take blood pressure once daily for 2 weeks, then 2-3 times a week.  Write numbers down in a notebook. 10/12/22   Enedelia Dorna HERO, FNP  cetirizine  (ZYRTEC  ALLERGY) 10 MG tablet Take 1 tablet (10 mg total) by mouth at bedtime. 05/01/24 07/30/24  Joesph Shaver Scales, PA-C    Family History Family History  Problem Relation Age of Onset   Hypertension Maternal  Grandmother    Diabetes Maternal Grandmother     Social History Social History   Tobacco Use   Smoking status: Some Days    Types: Cigars   Smokeless tobacco: Never  Vaping Use   Vaping status: Former  Substance Use Topics   Alcohol use: Yes    Comment: wine occasionally   Drug use: Not Currently    Types: Marijuana     Allergies   Other   Review of Systems Review of Systems  Constitutional:  Negative for activity change, appetite change, fatigue and fever.  Eyes:  Negative for visual disturbance.  Respiratory:  Negative for shortness of breath.   Cardiovascular:  Negative for chest pain.  Gastrointestinal:  Negative for abdominal pain, diarrhea, nausea and vomiting.  Endocrine: Negative for polydipsia, polyphagia and polyuria.  Genitourinary:  Positive for vaginal discharge. Negative for vaginal bleeding and vaginal pain.     Physical Exam Triage Vital Signs ED Triage Vitals [07/06/24 1558]  Encounter Vitals Group     BP 130/87     Girls Systolic BP Percentile      Girls Diastolic BP Percentile      Boys Systolic BP Percentile      Boys Diastolic BP Percentile      Pulse Rate (!) 103     Resp 16     Temp 98.6 F (37 C)     Temp Source Oral     SpO2 93 %     Weight      Height      Head Circumference      Peak Flow      Pain Score 0     Pain Loc      Pain Education      Exclude from Growth Chart    No data found.  Updated Vital Signs BP 130/87 (BP Location: Right Arm)   Pulse 94   Temp 98.6 F (37 C) (Oral)   Resp 16   LMP 06/13/2024   SpO2 98%   Visual Acuity Right Eye Distance:   Left Eye Distance:   Bilateral Distance:    Right Eye Near:   Left Eye Near:    Bilateral Near:     Physical Exam Vitals reviewed.  Constitutional:      General: She is awake. She is not in acute distress.    Appearance: Normal appearance. She is well-developed. She is not ill-appearing.     Comments: Very pleasant female appears stated age in no acute  distress sitting comfortably in exam room  HENT:     Head: Normocephalic and atraumatic.  Cardiovascular:     Rate and Rhythm: Normal rate and regular rhythm.  Heart sounds: Normal heart sounds, S1 normal and S2 normal. No murmur heard. Pulmonary:     Effort: Pulmonary effort is normal.     Breath sounds: Normal breath sounds. No wheezing, rhonchi or rales.     Comments: Clear to auscultation bilaterally Abdominal:     Palpations: Abdomen is soft.     Tenderness: There is no abdominal tenderness. There is no right CVA tenderness or left CVA tenderness.  Psychiatric:        Behavior: Behavior is cooperative.      UC Treatments / Results  Labs (all labs ordered are listed, but only abnormal results are displayed) Labs Reviewed  POCT URINALYSIS DIP (MANUAL ENTRY) - Abnormal; Notable for the following components:      Result Value   Clarity, UA cloudy (*)    Glucose, UA >=1,000 (*)    Ketones, POC UA small (15) (*)    Blood, UA moderate (*)    All other components within normal limits  POCT FASTING CBG [facility] MANUAL ENTRY - Abnormal; Notable for the following components:   POCT Glucose ([facility]) 386 (*)    All other components within normal limits  HIV ANTIBODY (ROUTINE TESTING W REFLEX)  RPR  COMPREHENSIVE METABOLIC PANEL WITH GFR  HEMOGLOBIN A1C  INSULIN , RANDOM  CERVICOVAGINAL ANCILLARY ONLY    EKG   Radiology No results found.  Procedures Procedures (including critical care time)  Medications Ordered in UC Medications - No data to display  Initial Impression / Assessment and Plan / UC Course  I have reviewed the triage vital signs and the nursing notes.  Pertinent labs & imaging results that were available during my care of the patient were reviewed by me and considered in my medical decision making (see chart for details).     Patient is well-appearing, afebrile, nontoxic, nontachycardic.  We discussed that her symptoms related to recurrent yeast vaginitis are  likely related to uncontrolled diabetes.  Patient was initially shocked and upset at this diagnosis as we do not been able to contact her last year when she had an elevated A1c.  We discussed at length complications of uncontrolled diabetes as well as typical clinical course.  I have a high suspicion for type 2 diabetes given she has been hyperglycemic for over a year without significant complications including hospitalization for DKA.  Her urine did continue to have glucosuria as well as trace ketones but she denies any significant symptoms including nausea, vomiting, shortness of breath, confusion.  Will defer ER evaluation for the time being because she has a significant anion gap on her metabolic panel we will recommend ER evaluation.  Basic blood work including CMP, A1c, random insulin  level was obtained to help direct her treatment.  We discussed that we will start medication to treat diabetes but wait to prescribe this until after we have her results to inform these decisions.  We will start Diflucan  to help treat recurrent yeast vaginitis and we discussed that she should take her first dose today and a second dose in 3 days.  She was given a glucometer and recommended that she obtain a fasting glucose to help adjust her medication and keep a log for evaluation of follow-up appointment.  She is established with a primary care and was encouraged to follow-up with them as soon as possible for additional evaluation and medication management but if she has trouble seeing them she can return here in 2 to 4 weeks.  She requests STI testing which was  obtained we will make additional accommodations based on these lab results.  We discussed that if she has any worsening symptoms that she needs to be seen immediately.  Strict return precautions given.  All questions were answered to patient satisfaction.  Final Clinical Impressions(s) / UC Diagnoses   Final diagnoses:  Vaginal discharge  Recurrent candidiasis of  vagina  Type 2 diabetes mellitus with hyperglycemia, without long-term current use of insulin  [facility])     Discharge Instructions      As we discussed, you have diabetes.  I will contact you once I have your blood work and we will start medication to help manage this.  In the meantime, I recommend that you avoid carbohydrates particularly things that are high in sugar such as soda, cookies, candy, pasta, bread.  Make sure you are drinking plenty of water.  I would like you to start monitoring your blood sugar in the morning using the glucometer that I sent to the pharmacy.  Keep a log of this for follow-up with primary care.  I believe that uncontrolled diabetes is causing your recurrent yeast infection.  Start Diflucan  today take a second dose in 3 days if you need to.  Once we get your sugar under control I am hopeful that this will stop happening so frequently.  It is very important that we get diabetes under control as it can cause long-term issues including trouble healing, vision change, kidney issues, heart disease.  Please follow-up with your primary care as soon as possible so they can help adjust the medication.  If you have any shortness of breath, nausea, vomiting, weakness you need to be seen immediately.     ED Prescriptions     Medication Sig Dispense Auth. Provider   fluconazole  (DIFLUCAN ) 150 MG tablet Take 1 tablet (150 mg total) by mouth every 3 (three) days as needed for up to 2 doses. 2 tablet Blaine Guiffre K, PA-C   Blood Glucose Monitoring Suppl DEVI 1 each by Does not apply route in the morning, at noon, and at bedtime. May substitute to any manufacturer covered by patient's insurance. 1 each Erisa Mehlman K, PA-C   Glucose Blood (BLOOD GLUCOSE TEST STRIPS) STRP 1 each by In Vitro route in the morning, at noon, and at bedtime. May substitute to any manufacturer covered by patient's insurance. 100 strip Laquia Rosano K, PA-C   Lancet Device MISC 1 each by Does not apply route in  the morning, at noon, and at bedtime. May substitute to any manufacturer covered by patient's insurance. 1 each Jasiah Elsen K, PA-C   Lancets Misc. MISC 1 each by Does not apply route in the morning, at noon, and at bedtime. May substitute to any manufacturer covered by patient's insurance. 100 each Zaela Graley, Rocky POUR, PA-C      PDMP not reviewed this encounter.   Sherrell Rocky POUR, PA-C 07/06/24 1748

## 2024-07-06 NOTE — Discharge Instructions (Addendum)
 As we discussed, you have diabetes.  I will contact you once I have your blood work and we will start medication to help manage this.  In the meantime, I recommend that you avoid carbohydrates particularly things that are high in sugar such as soda, cookies, candy, pasta, bread.  Make sure you are drinking plenty of water.  I would like you to start monitoring your blood sugar in the morning using the glucometer that I sent to the pharmacy.  Keep a log of this for follow-up with primary care.  I believe that uncontrolled diabetes is causing your recurrent yeast infection.  Start Diflucan  today take a second dose in 3 days if you need to.  Once we get your sugar under control I am hopeful that this will stop happening so frequently.  It is very important that we get diabetes under control as it can cause long-term issues including trouble healing, vision change, kidney issues, heart disease.  Please follow-up with your primary care as soon as possible so they can help adjust the medication.  If you have any shortness of breath, nausea, vomiting, weakness you need to be seen immediately.

## 2024-07-06 NOTE — ED Triage Notes (Addendum)
 Patient c/o vaginal itching 2 weeks. Patient states she had the itching prior to her period and once that ended it returned.Patient states she also has a rash between her thighs and buttocks when she scratches. Patient also c/o white/gray vaginal discharge x 2 weeks.  Patient denies abdominal pain, dysuria, or urinary frequency.  Patient is also requesting STD blood work.

## 2024-07-07 ENCOUNTER — Ambulatory Visit (HOSPITAL_COMMUNITY): Payer: Self-pay | Admitting: Physician Assistant

## 2024-07-07 LAB — HEMOGLOBIN A1C
Hgb A1c MFr Bld: 11.8 % — ABNORMAL HIGH (ref 4.8–5.6)
Mean Plasma Glucose: 292 mg/dL

## 2024-07-07 LAB — CERVICOVAGINAL ANCILLARY ONLY
Bacterial Vaginitis (gardnerella): NEGATIVE
Candida Glabrata: POSITIVE — AB
Candida Vaginitis: POSITIVE — AB
Chlamydia: NEGATIVE
Comment: NEGATIVE
Comment: NEGATIVE
Comment: NEGATIVE
Comment: NEGATIVE
Comment: NEGATIVE
Comment: NORMAL
Neisseria Gonorrhea: NEGATIVE
Trichomonas: NEGATIVE

## 2024-07-07 LAB — INSULIN, RANDOM: Insulin: 16.8 u[IU]/mL (ref 2.6–24.9)

## 2024-07-07 LAB — RPR: RPR Ser Ql: NONREACTIVE

## 2024-07-07 MED ORDER — SITAGLIPTIN PHOSPHATE 50 MG PO TABS: 50.0000 mg | ORAL_TABLET | Freq: Every day | ORAL | 1 refills | Status: DC

## 2024-07-07 MED ORDER — METFORMIN HCL ER (OSM) 500 MG PO TB24: ORAL_TABLET | ORAL | 1 refills | Status: DC

## 2024-07-10 ENCOUNTER — Other Ambulatory Visit (HOSPITAL_BASED_OUTPATIENT_CLINIC_OR_DEPARTMENT_OTHER): Payer: Self-pay

## 2024-07-10 ENCOUNTER — Other Ambulatory Visit: Payer: Self-pay

## 2024-07-10 ENCOUNTER — Telehealth (HOSPITAL_COMMUNITY): Payer: Self-pay

## 2024-07-10 MED ORDER — ACCU-CHEK SOFTCLIX LANCETS MISC
1.0000 | Freq: Three times a day (TID) | 0 refills | Status: AC
  Filled 2024-07-10: qty 100, 33d supply, fill #0
  Filled 2024-07-11 – 2024-07-14 (×3): qty 100, 34d supply, fill #0

## 2024-07-10 MED ORDER — SITAGLIPTIN PHOSPHATE 50 MG PO TABS
50.0000 mg | ORAL_TABLET | Freq: Every day | ORAL | 1 refills | Status: AC
  Filled 2024-07-10: qty 30, 30d supply, fill #0

## 2024-07-10 MED ORDER — METFORMIN HCL ER 500 MG PO TB24
500.0000 mg | ORAL_TABLET | Freq: Every day | ORAL | 1 refills | Status: DC
  Filled 2024-07-10: qty 90, 90d supply, fill #0

## 2024-07-10 MED ORDER — BLOOD GLUCOSE MONITOR SYSTEM W/DEVICE KIT
1.0000 | PACK | Freq: Three times a day (TID) | 0 refills | Status: AC
  Filled 2024-07-10: qty 1, fill #0
  Filled 2024-07-11: qty 1, 30d supply, fill #0

## 2024-07-10 MED ORDER — LANCET DEVICE MISC
1.0000 | Freq: Three times a day (TID) | 0 refills | Status: AC
  Filled 2024-07-10 – 2024-07-13 (×3): qty 1, 30d supply, fill #0

## 2024-07-10 MED ORDER — BLOOD PRESSURE CUFF MISC
0 refills | Status: AC
  Filled 2024-07-10 – 2024-07-11 (×2): qty 1, fill #0

## 2024-07-10 MED ORDER — METFORMIN HCL ER 500 MG PO TB24
ORAL_TABLET | ORAL | 1 refills | Status: AC
  Filled 2024-07-10: qty 120, 30d supply, fill #0

## 2024-07-10 NOTE — Telephone Encounter (Signed)
 Pharmacy change

## 2024-07-11 ENCOUNTER — Other Ambulatory Visit: Payer: Self-pay

## 2024-07-13 ENCOUNTER — Other Ambulatory Visit: Payer: Self-pay

## 2024-07-14 ENCOUNTER — Other Ambulatory Visit: Payer: Self-pay

## 2024-09-09 ENCOUNTER — Ambulatory Visit (HOSPITAL_COMMUNITY)

## 2024-09-10 ENCOUNTER — Ambulatory Visit (HOSPITAL_COMMUNITY)
Admission: RE | Admit: 2024-09-10 | Discharge: 2024-09-10 | Disposition: A | Discharge status: Home / Self Care | Source: Ambulatory Visit

## 2024-09-10 ENCOUNTER — Encounter (HOSPITAL_COMMUNITY): Payer: Self-pay

## 2024-09-10 ENCOUNTER — Other Ambulatory Visit: Payer: Self-pay

## 2024-09-10 VITALS — BP 139/93 | HR 80 | Temp 98.6°F | Resp 18

## 2024-09-10 DIAGNOSIS — N760 Acute vaginitis: Secondary | ICD-10-CM | POA: Insufficient documentation

## 2024-09-10 LAB — HEPATITIS C ANTIBODY: HCV Ab: NONREACTIVE

## 2024-09-10 LAB — HIV ANTIBODY (ROUTINE TESTING W REFLEX): HIV Screen 4th Generation wRfx: NONREACTIVE

## 2024-09-10 MED ORDER — METRONIDAZOLE 500 MG PO TABS
500.0000 mg | ORAL_TABLET | Freq: Two times a day (BID) | ORAL | 0 refills | Status: AC
  Filled 2024-09-10: qty 14, 7d supply, fill #0

## 2024-09-10 MED ORDER — FLUCONAZOLE 150 MG PO TABS
ORAL_TABLET | ORAL | 1 refills | Status: DC
  Filled 2024-09-10: qty 2, 3d supply, fill #0

## 2024-09-10 NOTE — ED Triage Notes (Signed)
 Pt c/o vaginal itching and discharge x1wk. States feels like a yeast infection. Requesting STD testing with blood work.

## 2024-09-10 NOTE — ED Provider Notes (Signed)
 [facility]    CSN: [id number] Arrival date & time: 09/10/24  1426      History   Chief Complaint Chief Complaint  Patient presents with   Vaginal Itching    Entered by patient    HPI Emma Jacobs is a 28 y.o. female.   Patient presents today due to 1 week worth of malodorous, white, thick vaginal discharge.  Patient states that she recently was diagnosed with diabetes acuminate this may be a yeast infection.  Patient also complains of vaginal itching.  Patient states that she has also had bacterial vaginosis in the seem similar to that as well.  Patient would also like to be tested for STIs.  Patient denies urinary frequency, back pain, nausea, vomiting, or dysuria.   Vaginal Itching    Past Medical History:  Diagnosis Date   Asthma    currently uses inhaler   Gestational hypertension     Patient Active Problem List   Diagnosis Date Noted   Viral pharyngitis 04/21/2024   Vaginal discharge 04/21/2024   Seasonal allergies 04/21/2024   Cesarean delivery delivered 01/25/2014   Labor and delivery indication for care or intervention 01/24/2014   Pregnancy 12/25/2013   Gestational hypertension 12/15/2013   Pyelectasis of fetus on prenatal ultrasound 09/18/2013   Teen pregnancy 08/11/2013   Family history of diabetes mellitus (DM) 08/11/2013   Obesity 08/11/2013    Past Surgical History:  Procedure Laterality Date   CESAREAN SECTION N/A 01/25/2014   Procedure: CESAREAN SECTION;  Surgeon: Olam Mill, MD;  Location: [facility];  Service: Obstetrics;  Laterality: N/A;   NO PAST SURGERIES      OB History     Gravida  1   Para  1   Term  1   Preterm      AB      Living  1      SAB      IAB      Ectopic      Multiple      Live Births  1            Home Medications    Prior to Admission medications   Medication Sig Start Date End Date Taking? Authorizing Provider  fluconazole  (DIFLUCAN ) 150 MG tablet Take 1 tablet orally on  day 1 and repeat 3 days later 09/10/24  Yes Andra Krabbe C, PA-C  metroNIDAZOLE  (FLAGYL ) 500 MG tablet Take 1 tablet (500 mg total) by mouth 2 (two) times daily. 09/10/24  Yes Andra Krabbe C, PA-C  Blood Glucose Monitoring Suppl (BLOOD GLUCOSE MONITOR SYSTEM) w/Device KIT Use as directed for blood sugar testing 07/10/24   Raspet, Erin K, PA-C  Blood Pressure Monitoring (BLOOD PRESSURE CUFF) MISC Take blood pressure once daily for 2 weeks, then 2-3 times a week.  Write numbers down in a notebook. 07/10/24   Raspet, Rocky POUR, PA-C  cetirizine  (ZYRTEC  ALLERGY) 10 MG tablet Take 1 tablet (10 mg total) by mouth at bedtime. 05/01/24 07/30/24  Joesph Shaver Scales, PA-C  Glucose Blood (BLOOD GLUCOSE TEST STRIPS) STRP Testing in the morning, at noon, and at bedtime. 07/06/24   Raspet, Erin K, PA-C  metFORMIN  (GLUCOPHAGE -XR) 500 MG 24 hr tablet Take one tablet (500mg ) tablet daily for one week, THEN take one tablet (500mg ) twice a day for a week (1000mg  total daily).  THEN take 2 tablets in the morning and 1 tablet (500mg ) at night for a week (1500mg  total daily).  CONTINUE taking two tablets (1000mg )  twice a day thereafter. 07/10/24   Raspet, Rocky POUR, PA-C  sitaGLIPtin  (JANUVIA ) 50 MG tablet Take 1 tablet (50 mg total) by mouth daily. 07/10/24   Raspet, Rocky POUR, PA-C    Family History Family History  Problem Relation Age of Onset   Hypertension Maternal Grandmother    Diabetes Maternal Grandmother     Social History Social History   Tobacco Use   Smoking status: Some Days    Types: Cigars   Smokeless tobacco: Never  Vaping Use   Vaping status: Former  Substance Use Topics   Alcohol use: Yes    Comment: wine occasionally   Drug use: Not Currently    Types: Marijuana     Allergies   Other   Review of Systems Review of Systems   Physical Exam Triage Vital Signs ED Triage Vitals  Encounter Vitals Group     BP 09/10/24 1503 (!) 139/93     Girls Systolic BP Percentile --      Girls  Diastolic BP Percentile --      Boys Systolic BP Percentile --      Boys Diastolic BP Percentile --      Pulse Rate 09/10/24 1503 80     Resp 09/10/24 1503 18     Temp 09/10/24 1503 98.6 F (37 C)     Temp Source 09/10/24 1503 Oral     SpO2 09/10/24 1503 98 %     Weight --      Height --      Head Circumference --      Peak Flow --      Pain Score 09/10/24 1501 0     Pain Loc --      Pain Education --      Exclude from Growth Chart --    No data found.  Updated Vital Signs BP (!) 139/93 (BP Location: Left Arm)   Pulse 80   Temp 98.6 F (37 C) (Oral)   Resp 18   LMP 08/24/2024 (Exact Date)   SpO2 98%   Visual Acuity Right Eye Distance:   Left Eye Distance:   Bilateral Distance:    Right Eye Near:   Left Eye Near:    Bilateral Near:     Physical Exam Vitals and nursing note reviewed.  Constitutional:      General: She is not in acute distress.    Appearance: Normal appearance. She is not ill-appearing, toxic-appearing or diaphoretic.  Eyes:     General: No scleral icterus. Cardiovascular:     Rate and Rhythm: Normal rate and regular rhythm.     Heart sounds: Normal heart sounds.  Pulmonary:     Effort: Pulmonary effort is normal. No respiratory distress.     Breath sounds: Normal breath sounds. No wheezing or rhonchi.  Abdominal:     General: Abdomen is flat. Bowel sounds are normal.     Palpations: Abdomen is soft.     Tenderness: There is no abdominal tenderness. There is no right CVA tenderness or left CVA tenderness.  Skin:    General: Skin is warm.  Neurological:     Mental Status: She is alert and oriented to person, place, and time.  Psychiatric:        Mood and Affect: Mood normal.        Behavior: Behavior normal.      UC Treatments / Results  Labs (all labs ordered are listed, but only abnormal results are displayed) Labs Reviewed  RPR  HIV ANTIBODY (ROUTINE TESTING W REFLEX)  HEPATITIS C ANTIBODY  CERVICOVAGINAL ANCILLARY ONLY     EKG   Radiology No results found.  Procedures Procedures (including critical care time)  Medications Ordered in UC Medications - No data to display  Initial Impression / Assessment and Plan / UC Course  I have reviewed the triage vital signs and the nursing notes.  Pertinent labs & imaging results that were available during my care of the patient were reviewed by me and considered in my medical decision making (see chart for details).     Acute vaginitis-patient treated for both BV and vaginal yeast infection.  Patient was tested for gonorrhea, chlamydia, trichomoniasis, HIV, syphilis, HCV. Final Clinical Impressions(s) / UC Diagnoses   Final diagnoses:  Acute vaginitis   Discharge Instructions   None    ED Prescriptions     Medication Sig Dispense Auth. Provider   metroNIDAZOLE  (FLAGYL ) 500 MG tablet Take 1 tablet (500 mg total) by mouth 2 (two) times daily. 14 tablet Jennica Tagliaferri C, PA-C   fluconazole  (DIFLUCAN ) 150 MG tablet Take 1 tablet orally on day 1 and repeat 3 days later 2 tablet Andra Corean BROCKS, PA-C      PDMP not reviewed this encounter.   Andra Corean BROCKS, PA-C 09/10/24 1524

## 2024-09-11 ENCOUNTER — Ambulatory Visit (HOSPITAL_COMMUNITY): Payer: Self-pay

## 2024-09-11 LAB — CERVICOVAGINAL ANCILLARY ONLY
Bacterial Vaginitis (gardnerella): POSITIVE — AB
Candida Glabrata: POSITIVE — AB
Candida Vaginitis: POSITIVE — AB
Chlamydia: NEGATIVE
Comment: NEGATIVE
Comment: NEGATIVE
Comment: NEGATIVE
Comment: NEGATIVE
Comment: NEGATIVE
Comment: NORMAL
Neisseria Gonorrhea: NEGATIVE
Trichomonas: NEGATIVE

## 2024-09-11 LAB — RPR
RPR Ser Ql: REACTIVE — AB
RPR Titer: 1:8 {titer}

## 2024-09-14 LAB — T.PALLIDUM AB, TOTAL: T Pallidum Abs: NONREACTIVE

## 2024-09-15 ENCOUNTER — Other Ambulatory Visit: Payer: Self-pay

## 2024-10-10 ENCOUNTER — Ambulatory Visit (HOSPITAL_COMMUNITY)

## 2024-11-14 ENCOUNTER — Telehealth: Admitting: Family Medicine

## 2024-11-14 DIAGNOSIS — N760 Acute vaginitis: Secondary | ICD-10-CM

## 2024-11-14 MED ORDER — FLUCONAZOLE 150 MG PO TABS
ORAL_TABLET | ORAL | 0 refills | Status: AC
  Filled 2024-11-14: qty 2, 3d supply, fill #0

## 2024-11-14 NOTE — Progress Notes (Signed)

## 2024-11-16 ENCOUNTER — Other Ambulatory Visit: Payer: Self-pay
